# Patient Record
Sex: Male | Born: 1935 | Race: White | Hispanic: No | Marital: Married | Smoking: Never smoker
Health system: Southern US, Community
[De-identification: ages and names within clinical notes are randomized; demographics above are authoritative.]

## PROBLEM LIST (undated history)

## (undated) DIAGNOSIS — E039 Hypothyroidism, unspecified: Secondary | ICD-10-CM

## (undated) DIAGNOSIS — E785 Hyperlipidemia, unspecified: Secondary | ICD-10-CM

## (undated) DIAGNOSIS — E7800 Pure hypercholesterolemia, unspecified: Secondary | ICD-10-CM

## (undated) DIAGNOSIS — I10 Essential (primary) hypertension: Secondary | ICD-10-CM

## (undated) DIAGNOSIS — J309 Allergic rhinitis, unspecified: Secondary | ICD-10-CM

## (undated) DIAGNOSIS — K219 Gastro-esophageal reflux disease without esophagitis: Secondary | ICD-10-CM

## (undated) DIAGNOSIS — E119 Type 2 diabetes mellitus without complications: Secondary | ICD-10-CM

## (undated) DIAGNOSIS — K635 Polyp of colon: Secondary | ICD-10-CM

## (undated) HISTORY — DX: Essential (primary) hypertension: I10

## (undated) HISTORY — DX: Polyp of colon: K63.5

## (undated) HISTORY — DX: Allergic rhinitis, unspecified: J30.9

## (undated) HISTORY — PX: TONSILLECTOMY: SUR1361

## (undated) HISTORY — DX: Type 2 diabetes mellitus without complications: E11.9

## (undated) HISTORY — DX: Gastro-esophageal reflux disease without esophagitis: K21.9

## (undated) HISTORY — PX: FACIAL RECONSTRUCTION SURGERY: SHX631

## (undated) HISTORY — DX: Hypothyroidism, unspecified: E03.9

## (undated) HISTORY — DX: Pure hypercholesterolemia, unspecified: E78.00

## (undated) HISTORY — DX: Hyperlipidemia, unspecified: E78.5

---

## 2003-07-01 ENCOUNTER — Other Ambulatory Visit: Payer: Self-pay

## 2005-08-19 ENCOUNTER — Emergency Department: Payer: Self-pay | Admitting: Emergency Medicine

## 2008-08-09 ENCOUNTER — Emergency Department: Payer: Self-pay | Admitting: Emergency Medicine

## 2010-02-06 ENCOUNTER — Emergency Department: Payer: Self-pay | Admitting: Emergency Medicine

## 2010-02-17 ENCOUNTER — Ambulatory Visit: Payer: Self-pay

## 2010-04-17 ENCOUNTER — Ambulatory Visit: Payer: Self-pay | Admitting: Anesthesiology

## 2010-04-25 ENCOUNTER — Ambulatory Visit: Payer: Self-pay | Admitting: General Practice

## 2010-08-20 ENCOUNTER — Ambulatory Visit: Payer: Self-pay | Admitting: Internal Medicine

## 2010-08-31 ENCOUNTER — Ambulatory Visit: Payer: Self-pay | Admitting: Internal Medicine

## 2010-09-30 ENCOUNTER — Ambulatory Visit: Payer: Self-pay | Admitting: Internal Medicine

## 2010-10-31 ENCOUNTER — Ambulatory Visit: Payer: Self-pay | Admitting: Internal Medicine

## 2012-05-13 DIAGNOSIS — Z8042 Family history of malignant neoplasm of prostate: Secondary | ICD-10-CM | POA: Insufficient documentation

## 2012-05-13 DIAGNOSIS — M5430 Sciatica, unspecified side: Secondary | ICD-10-CM | POA: Insufficient documentation

## 2012-05-13 DIAGNOSIS — N529 Male erectile dysfunction, unspecified: Secondary | ICD-10-CM | POA: Insufficient documentation

## 2013-07-13 ENCOUNTER — Emergency Department: Payer: Self-pay | Admitting: Emergency Medicine

## 2013-07-13 LAB — URINALYSIS, COMPLETE
BACTERIA: NONE SEEN
BILIRUBIN, UR: NEGATIVE
Glucose,UR: NEGATIVE mg/dL (ref 0–75)
Ketone: NEGATIVE
Leukocyte Esterase: NEGATIVE
Nitrite: NEGATIVE
Ph: 7 (ref 4.5–8.0)
Protein: NEGATIVE
SPECIFIC GRAVITY: 1.008 (ref 1.003–1.030)
Squamous Epithelial: NONE SEEN

## 2013-07-13 LAB — CBC WITH DIFFERENTIAL/PLATELET
BASOS PCT: 0.7 %
Basophil #: 0 10*3/uL (ref 0.0–0.1)
Eosinophil #: 0.3 10*3/uL (ref 0.0–0.7)
Eosinophil %: 5.4 %
HCT: 39.6 % — AB (ref 40.0–52.0)
HGB: 12.9 g/dL — ABNORMAL LOW (ref 13.0–18.0)
Lymphocyte #: 1.9 10*3/uL (ref 1.0–3.6)
Lymphocyte %: 30.4 %
MCH: 31.5 pg (ref 26.0–34.0)
MCHC: 32.5 g/dL (ref 32.0–36.0)
MCV: 97 fL (ref 80–100)
Monocyte #: 0.7 x10 3/mm (ref 0.2–1.0)
Monocyte %: 11.8 %
Neutrophil #: 3.2 10*3/uL (ref 1.4–6.5)
Neutrophil %: 51.7 %
Platelet: 161 10*3/uL (ref 150–440)
RBC: 4.08 10*6/uL — ABNORMAL LOW (ref 4.40–5.90)
RDW: 13.4 % (ref 11.5–14.5)
WBC: 6.1 10*3/uL (ref 3.8–10.6)

## 2013-07-13 LAB — CK TOTAL AND CKMB (NOT AT ARMC)
CK, Total: 47 U/L
CK-MB: 1.2 ng/mL (ref 0.5–3.6)

## 2013-07-13 LAB — PROTIME-INR
INR: 1
Prothrombin Time: 12.8 secs (ref 11.5–14.7)

## 2013-07-13 LAB — TROPONIN I: Troponin-I: <0.02 ng/mL

## 2013-07-13 LAB — COMPREHENSIVE METABOLIC PANEL
ALBUMIN: 3.9 g/dL (ref 3.4–5.0)
ALK PHOS: 51 U/L
AST: 24 U/L (ref 15–37)
Anion Gap: 5 — ABNORMAL LOW (ref 7–16)
BUN: 16 mg/dL (ref 7–18)
Bilirubin,Total: 0.5 mg/dL (ref 0.2–1.0)
Calcium, Total: 9 mg/dL (ref 8.5–10.1)
Chloride: 103 mmol/L (ref 98–107)
Co2: 30 mmol/L (ref 21–32)
Creatinine: 0.95 mg/dL (ref 0.60–1.30)
EGFR (African American): >60
GLUCOSE: 132 mg/dL — AB (ref 65–99)
Osmolality: 279 (ref 275–301)
POTASSIUM: 3.6 mmol/L (ref 3.5–5.1)
SGPT (ALT): 25 U/L (ref 12–78)
Sodium: 138 mmol/L (ref 136–145)
TOTAL PROTEIN: 7.5 g/dL (ref 6.4–8.2)

## 2013-07-22 ENCOUNTER — Encounter: Payer: Self-pay | Admitting: Cardiovascular Disease

## 2013-07-22 ENCOUNTER — Ambulatory Visit (INDEPENDENT_AMBULATORY_CARE_PROVIDER_SITE_OTHER): Payer: Medicare Other | Admitting: Cardiovascular Disease

## 2013-07-22 ENCOUNTER — Encounter (INDEPENDENT_AMBULATORY_CARE_PROVIDER_SITE_OTHER): Payer: Self-pay

## 2013-07-22 VITALS — BP 142/82 | HR 65 | Ht 73.0 in | Wt 204.5 lb

## 2013-07-22 DIAGNOSIS — I10 Essential (primary) hypertension: Secondary | ICD-10-CM

## 2013-07-22 DIAGNOSIS — E785 Hyperlipidemia, unspecified: Secondary | ICD-10-CM

## 2013-07-22 DIAGNOSIS — M25519 Pain in unspecified shoulder: Secondary | ICD-10-CM

## 2013-07-22 DIAGNOSIS — R142 Eructation: Secondary | ICD-10-CM

## 2013-07-22 DIAGNOSIS — K219 Gastro-esophageal reflux disease without esophagitis: Secondary | ICD-10-CM

## 2013-07-22 DIAGNOSIS — R143 Flatulence: Secondary | ICD-10-CM

## 2013-07-22 DIAGNOSIS — R141 Gas pain: Secondary | ICD-10-CM

## 2013-07-22 DIAGNOSIS — R9431 Abnormal electrocardiogram [ECG] [EKG]: Secondary | ICD-10-CM

## 2013-07-22 DIAGNOSIS — R140 Abdominal distension (gaseous): Secondary | ICD-10-CM

## 2013-07-22 DIAGNOSIS — E039 Hypothyroidism, unspecified: Secondary | ICD-10-CM

## 2013-07-22 NOTE — Patient Instructions (Signed)
You are doing well. No medication changes were made.  Please call us if you have new issues that need to be addressed before your next appt.    

## 2013-07-22 NOTE — Progress Notes (Signed)
Patient ID: Rickey Marteslton Eugene Vasko [name], male    DOB: 11-18-1929, 80 y.o.   MRN: [id number]  HPI Comments: Rickey Ayala is a pleasant 80 year old gentleman with history of borderline diabetes, prior history of obesity, presenting with symptoms of irregular heartbeat, shoulder pain evaluation in the emergency room 07/13/2013. He has a history of hypertension, GERD, hyperlipidemia, thyroid disease  He reports that on the day of his symptoms, he had significant chili. In fact he reports having 2 bowls  of chili with beans . He had recommend this gas upper and lower that evening, worse and he is having in his entire life. At 3 AM symptoms started. He had some shooting sharp electric type nerve pain to his left shoulder on 2 occasions. He tried Mylanta and eventually went to the emergency room. He did report having an irregular rhythm. This seemed to resolve after history of to the emergency room.  In the ER, lab workup was reviewed EKG was reviewed. Lab work is normal. EKG showed normal sinus rhythm with APCs on every fourth beat otherwise no significant ST or T wave changes  Since leaving the hospital, he has felt well. Prior to this he was very active with no complaints of shortness of breath or chest pain. He has no prior smoking history, has been taking cholesterol pill for 10 years. Most recent total cholesterol 150, LDL 73 He has not been back to exercise since he was seen in the emergency room  EKG today shows normal sinus rhythm with rate 58 beats per minute, nonspecific ST abnormality likely from early repolarization. (EKG on today's office visit than early in his visit showed inferior wall Q waves. This was likely from lead placement error)   Outpatient Encounter Prescriptions as of 07/22/2013  Medication Sig  . aspirin 81 MG tablet Take 81 mg by mouth daily.  Marland Kitchen. atorvastatin (LIPITOR) 20 MG tablet Take 20 mg by mouth daily.  . carvedilol (COREG) 25 MG tablet Take 25 mg by mouth 2 (two) times  daily with a meal.  . finasteride (PROSCAR) 5 MG tablet Take 5 mg by mouth daily.  Marland Kitchen. levothyroxine (SYNTHROID, LEVOTHROID) 88 MCG tablet Take 88 mcg by mouth daily before breakfast.  . lisinopril-hydrochlorothiazide (PRINZIDE,ZESTORETIC) 20-12.5 MG per tablet Take 2 tablets by mouth daily.   . Multiple Vitamin (MULTIVITAMIN) tablet Take 1 tablet by mouth daily.  Marland Kitchen. omeprazole (PRILOSEC) 20 MG capsule Take 20 mg by mouth daily.  . potassium chloride SA (K-DUR,KLOR-CON) 20 MEQ tablet Take 20 mEq by mouth daily.     Review of Systems  Constitutional: Negative.   HENT: Negative.   Eyes: Negative.   Respiratory: Negative.   Cardiovascular: Negative.   Gastrointestinal: Negative.        Recent GI distress with gas, shooting left shoulder pain  Endocrine: Negative.   Musculoskeletal: Negative.   Skin: Negative.   Allergic/Immunologic: Negative.   Neurological: Negative.   Hematological: Negative.   Psychiatric/Behavioral: Negative.   All other systems reviewed and are negative.   BP 142/82  Pulse 65  Ht 6\' 1"  (1.854 m)  Wt 204 lb 8 oz (92.761 kg)  BMI 26.99 kg/m2  Physical Exam  Nursing note and vitals reviewed. Constitutional: He is oriented to person, place, and time. He appears well-developed and well-nourished.  HENT:  Head: Normocephalic.  Nose: Nose normal.  Mouth/Throat: Oropharynx is clear and moist.  Eyes: Conjunctivae are normal. Pupils are equal, round, and reactive to light.  Neck: Normal range of motion.  Neck supple. No JVD present.  Cardiovascular: Normal rate, regular rhythm, S1 normal, S2 normal, normal heart sounds and intact distal pulses.  Exam reveals no gallop and no friction rub.   No murmur heard. Pulmonary/Chest: Effort normal and breath sounds normal. No respiratory distress. He has no wheezes. He has no rales. He exhibits no tenderness.  Abdominal: Soft. Bowel sounds are normal. He exhibits no distension. There is no tenderness.  Musculoskeletal:  Normal range of motion. He exhibits no edema and no tenderness.  Lymphadenopathy:    He has no cervical adenopathy.  Neurological: He is alert and oriented to person, place, and time. Coordination normal.  Skin: Skin is warm and dry. No rash noted. No erythema.  Psychiatric: He has a normal mood and affect. His behavior is normal. Judgment and thought content normal.      Assessment and Plan

## 2013-07-22 NOTE — Assessment & Plan Note (Signed)
He recently started himself on over-the-counter omeprazole

## 2013-07-22 NOTE — Assessment & Plan Note (Signed)
Blood pressure is well controlled on today's visit. No changes made to the medications. 

## 2013-07-22 NOTE — Assessment & Plan Note (Signed)
Recent symptoms of GI gas likely from eating chili in excess. No further symptoms since that time. Radiating pain to his left shoulder likely from gas pain

## 2013-07-22 NOTE — Assessment & Plan Note (Signed)
Monitored by Dr. Dareen PianoAnderson

## 2013-07-22 NOTE — Assessment & Plan Note (Signed)
Atypical for cardiac etiology. Symptoms in the setting of gas pain. No further symptoms since that time. Recommended he restart his exercise program and if he has additional episodes of shoulder pain or chest pain, that he call our office for further evaluation

## 2013-07-22 NOTE — Assessment & Plan Note (Signed)
Cholesterol is at goal on the current lipid regimen. No changes to the medications were made.  

## 2013-07-29 ENCOUNTER — Encounter: Payer: Self-pay | Admitting: Cardiovascular Disease

## 2013-08-29 DIAGNOSIS — I119 Hypertensive heart disease without heart failure: Secondary | ICD-10-CM | POA: Insufficient documentation

## 2013-08-29 DIAGNOSIS — E1169 Type 2 diabetes mellitus with other specified complication: Secondary | ICD-10-CM | POA: Insufficient documentation

## 2013-08-29 DIAGNOSIS — E1122 Type 2 diabetes mellitus with diabetic chronic kidney disease: Secondary | ICD-10-CM | POA: Insufficient documentation

## 2013-08-29 DIAGNOSIS — N181 Chronic kidney disease, stage 1: Secondary | ICD-10-CM

## 2013-08-29 DIAGNOSIS — E785 Hyperlipidemia, unspecified: Secondary | ICD-10-CM

## 2015-03-15 DIAGNOSIS — Z0000 Encounter for general adult medical examination without abnormal findings: Secondary | ICD-10-CM | POA: Insufficient documentation

## 2015-03-16 IMAGING — CR DG CHEST 1V PORT
1 series · 1 of 1 positions shown · non-contrast
Comparison: DG CHEST 1V PORT dated 08/09/2008

CLINICAL DATA: Chest pain

EXAM:
PORTABLE CHEST - 1 VIEW

[ap]
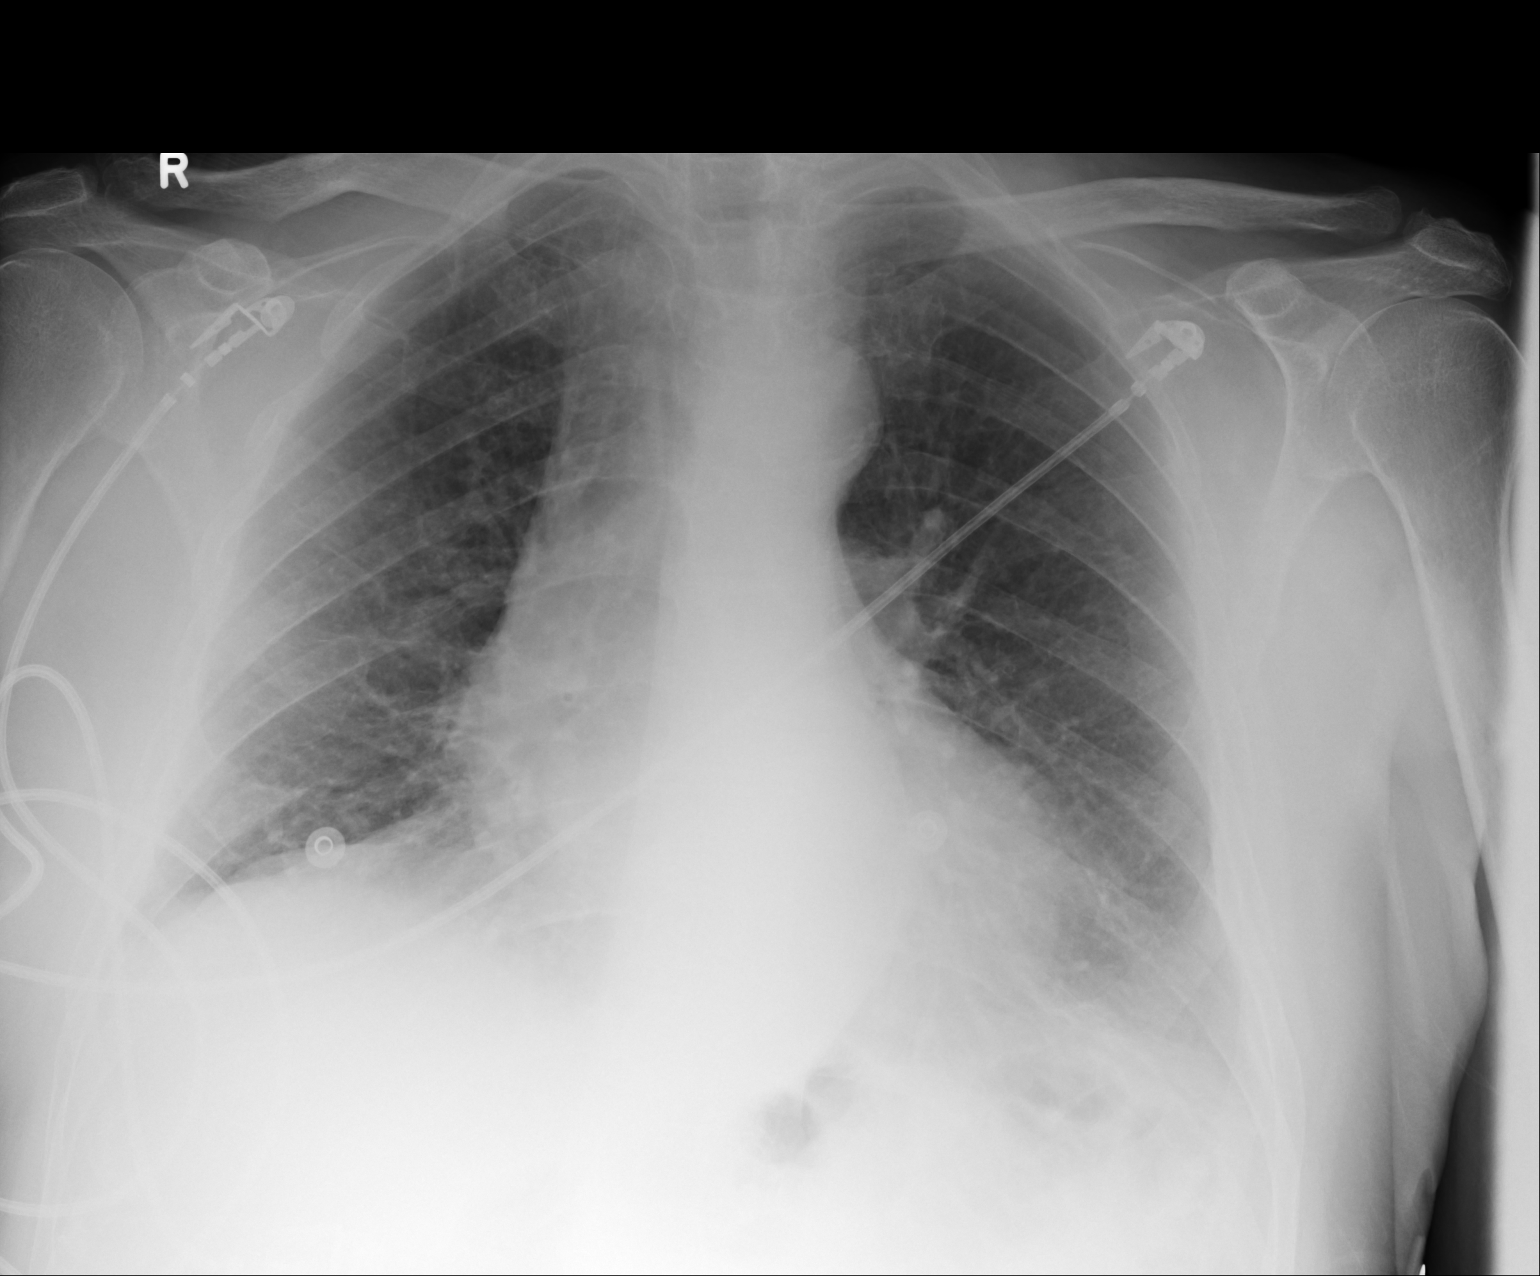

[1 of 1 positions shown; findings below may reference images not displayed]

FINDINGS: The heart size and mediastinal contours are within normal limits.
Both lungs are clear. The visualized skeletal structures are
unremarkable.
IMPRESSION: No active disease.

## 2017-06-26 ENCOUNTER — Ambulatory Visit: Payer: Self-pay | Admitting: Podiatry

## 2017-07-24 ENCOUNTER — Ambulatory Visit (INDEPENDENT_AMBULATORY_CARE_PROVIDER_SITE_OTHER): Payer: Medicare Other | Admitting: Podiatry

## 2017-07-24 ENCOUNTER — Encounter: Payer: Self-pay | Admitting: Podiatry

## 2017-07-24 DIAGNOSIS — M79674 Pain in right toe(s): Secondary | ICD-10-CM | POA: Diagnosis not present

## 2017-07-24 DIAGNOSIS — M79675 Pain in left toe(s): Secondary | ICD-10-CM | POA: Diagnosis not present

## 2017-07-24 DIAGNOSIS — B351 Tinea unguium: Secondary | ICD-10-CM

## 2017-07-24 DIAGNOSIS — L600 Ingrowing nail: Secondary | ICD-10-CM | POA: Diagnosis not present

## 2017-07-24 NOTE — Progress Notes (Signed)
Complaint:  Visit Type: Patient returns to my office for  preventative foot care services. Complaint: Patient states" my nails have grown long and thick and become painful to walk and wear shoes" Patient has been treated by Dr. Hyatt for ingrowing toenails.. The patient presents for preventative foot care services. No changes to ROS  Podiatric Exam: Vascular: dorsalis pedis and posterior tibial pulses are weakly  palpable bilateral. Capillary return is immediate. Temperature gradient is WNL. Skin turgor WNL  Sensorium: Normal Semmes Weinstein monofilament test. Normal tactile sensation bilaterally. Nail Exam: Pt has thick disfigured discolored nails with subungual debris noted bilateral entire nail hallux. Ulcer Exam: There is no evidence of ulcer or pre-ulcerative changes or infection. Orthopedic Exam: Muscle tone and strength are WNL. No limitations in general ROM. No crepitus or effusions noted. Foot type and digits show no abnormalities. Bony prominences are unremarkable. Skin: No Porokeratosis. No infection or ulcers  Diagnosis:  Onychomycosis, , Pain in right toe, pain in left toes  Treatment & Plan Procedures and Treatment: Consent by patient was obtained for treatment procedures.   Debridement of mycotic and hypertrophic toenails, 1 through 5 bilateral and clearing of subungual debris. No ulceration, no infection noted.  Return Visit-Office Procedure: Patient instructed to return to the office for a follow up visit 3 months for continued evaluation and treatment.    Kessler Kopinski DPM 

## 2017-10-23 ENCOUNTER — Ambulatory Visit: Payer: Medicare Other | Admitting: Podiatry

## 2017-10-27 ENCOUNTER — Ambulatory Visit (INDEPENDENT_AMBULATORY_CARE_PROVIDER_SITE_OTHER): Payer: Medicare Other | Admitting: Podiatry

## 2017-10-27 ENCOUNTER — Encounter: Payer: Self-pay | Admitting: Podiatry

## 2017-10-27 DIAGNOSIS — L600 Ingrowing nail: Secondary | ICD-10-CM

## 2017-10-27 DIAGNOSIS — M79675 Pain in left toe(s): Secondary | ICD-10-CM

## 2017-10-27 DIAGNOSIS — M79674 Pain in right toe(s): Secondary | ICD-10-CM | POA: Diagnosis not present

## 2017-10-27 DIAGNOSIS — B351 Tinea unguium: Secondary | ICD-10-CM

## 2017-10-27 NOTE — Progress Notes (Signed)
Complaint:  Visit Type: Patient returns to my office for  preventative foot care services. Complaint: Patient states" my nails have grown long and thick and become painful to walk and wear shoes" Patient has been treated by Dr. Hyatt for ingrowing toenails.. The patient presents for preventative foot care services. No changes to ROS  Podiatric Exam: Vascular: dorsalis pedis and posterior tibial pulses are weakly  palpable bilateral. Capillary return is immediate. Temperature gradient is WNL. Skin turgor WNL  Sensorium: Normal Semmes Weinstein monofilament test. Normal tactile sensation bilaterally. Nail Exam: Pt has thick disfigured discolored nails with subungual debris noted bilateral entire nail hallux. Ulcer Exam: There is no evidence of ulcer or pre-ulcerative changes or infection. Orthopedic Exam: Muscle tone and strength are WNL. No limitations in general ROM. No crepitus or effusions noted. Foot type and digits show no abnormalities. Bony prominences are unremarkable. Skin: No Porokeratosis. No infection or ulcers  Diagnosis:  Onychomycosis, , Pain in right toe, pain in left toes  Treatment & Plan Procedures and Treatment: Consent by patient was obtained for treatment procedures.   Debridement of mycotic and hypertrophic toenails, 1 through 5 bilateral and clearing of subungual debris. No ulceration, no infection noted.  Return Visit-Office Procedure: Patient instructed to return to the office for a follow up visit 3 months for continued evaluation and treatment.    Delesa Kawa DPM 

## 2017-11-05 ENCOUNTER — Telehealth: Payer: Self-pay | Admitting: Urology

## 2017-11-05 NOTE — Telephone Encounter (Signed)
Pt needs refill sent to [facility] on [address], has appt on 12/11/17. [facility] transfer from [facility].  finasteride (PROSCAR) 5 MG tablet [[id number]][[id number]]

## 2017-11-06 MED ORDER — FINASTERIDE 5 MG PO TABS: 5.0000 mg | ORAL_TABLET | Freq: Every day | ORAL | 1 refills | Status: DC

## 2017-12-11 ENCOUNTER — Ambulatory Visit (INDEPENDENT_AMBULATORY_CARE_PROVIDER_SITE_OTHER): Payer: Medicare Other | Admitting: Urology

## 2017-12-11 ENCOUNTER — Encounter: Payer: Self-pay | Admitting: Urology

## 2017-12-11 VITALS — BP 146/81 | HR 82 | Ht 72.25 in | Wt 202.4 lb

## 2017-12-11 DIAGNOSIS — R972 Elevated prostate specific antigen [PSA]: Secondary | ICD-10-CM

## 2017-12-11 DIAGNOSIS — N401 Enlarged prostate with lower urinary tract symptoms: Secondary | ICD-10-CM | POA: Diagnosis not present

## 2017-12-11 MED ORDER — FINASTERIDE 5 MG PO TABS: 5.0000 mg | ORAL_TABLET | Freq: Every day | ORAL | 3 refills | Status: DC

## 2017-12-11 NOTE — Progress Notes (Signed)
12/11/2017 4:03 PM   Rickey MartesAlton Eugene Kirks [name] 1929-12-18 [id number]  Referring provider: Lauro RegulusAnderson, Marshall W, MD [address] [facility] [place], [place] [id number]  Chief Complaint  Patient presents with  . Establish Care    HPI: 84 year old male presents to establish local urologic care.  He has been followed by Dr. Achilles Dunkope for the past several years for BPH with lower urinary tract symptoms.  He remains on finasteride.  He has mild lower urinary tract symptoms including frequency, urgency, intermittent urinary stream and nocturia x2.  His voiding pattern is stable and not bothersome.  Denies dysuria or gross hematuria.  Denies flank, abdominal, pelvic or scrotal pain.  His [facility] records mention history of an elevated PSA however there is no reference to a prior biopsy and all of his PSA levels have been fairly stable over the last several years.  His uncorrected PSA last year was 2.4 and in 2017 was 1.6.  He does state he had a biopsy performed at [facility] Wynn [facility] for PSA in the upper 4 range which was benign.   PMH: Past Medical History:  Diagnosis Date  . Allergic rhinitis   . Colon polyps   . Essential hypertension, benign   . GERD (gastroesophageal reflux disease)   . Other and unspecified hyperlipidemia   . Pure hypercholesterolemia   . Type II or unspecified type diabetes mellitus without mention of complication, not stated as uncontrolled   . Unspecified essential hypertension   . Unspecified hypothyroidism     Surgical History: Past Surgical History:  Procedure Laterality Date  . FACIAL RECONSTRUCTION SURGERY     muscle surgery   . TONSILLECTOMY      Home Medications:  Allergies as of 12/11/2017      Reactions   Niacin Itching   Penicillins Rash   Rash & joint pain.  And joint pain And joint pain      Medication List        Accurate as of 12/11/17  4:03 PM. Always use your most recent med list.            amLODipine 5 MG tablet Commonly known as:  NORVASC Take 5 mg by mouth daily.   aspirin EC 81 MG tablet Take by mouth.   atorvastatin 20 MG tablet Commonly known as:  LIPITOR TAKE 1 TABLET BY MOUTH ONCE DAILY   carvedilol 12.5 MG tablet Commonly known as:  COREG   finasteride 5 MG tablet Commonly known as:  PROSCAR Take 1 tablet (5 mg total) by mouth daily.   KLOR-CON M20 20 MEQ tablet Generic drug:  potassium chloride SA TAKE 1 TABLET BY MOUTH ONCE DAILY   levothyroxine 112 MCG tablet Commonly known as:  SYNTHROID, LEVOTHROID TAKE 1 TABLET BY MOUTH ONCE DAILY ON AN EMPTY STOMACH WITH A GLASS OF WATER AT LEAST 30 TO 60 MINUTES BEFORE BREAKFAST   lisinopril-hydrochlorothiazide 20-12.5 MG tablet Commonly known as:  PRINZIDE,ZESTORETIC TAKE 2 TABLETS BY MOUTH ONCE DAILY   LYCOPENE PO Take by mouth.   multivitamin tablet Take 1 tablet by mouth daily.   ONE TOUCH ULTRA TEST test strip Generic drug:  glucose blood Use 2 (two) times daily.   ONETOUCH DELICA LANCETS 33G Misc Use 1 each 2 (two) times daily.       Allergies:  Allergies  Allergen Reactions  . Niacin Itching  . Penicillins Rash    Rash & joint pain.  And joint pain And joint pain  Family History: Family History  Problem Relation Age of Onset  . Heart attack Mother   . Heart disease Mother   . Hypertension Mother   . Hyperlipidemia Mother     Social History:  reports that he has never smoked. He has never used smokeless tobacco. He reports that he does not drink alcohol or use drugs.  ROS: UROLOGY Frequent Urination?: Yes Hard to postpone urination?: Yes Burning/pain with urination?: No Get up at night to urinate?: Yes Leakage of urine?: Yes Urine stream starts and stops?: Yes Trouble starting stream?: No Do you have to strain to urinate?: No Blood in urine?: No Urinary tract infection?: No Sexually transmitted disease?: No Injury to kidneys or bladder?: No Painful  intercourse?: No Weak stream?: Yes Erection problems?: No Penile pain?: No  Gastrointestinal Nausea?: No Vomiting?: No Indigestion/heartburn?: No Diarrhea?: No Constipation?: Yes  Constitutional Fever: No Night sweats?: No Weight loss?: No Fatigue?: Yes  Skin Skin rash/lesions?: No Itching?: No  Eyes Blurred vision?: No Double vision?: No  Ears/Nose/Throat Sore throat?: No Sinus problems?: No  Hematologic/Lymphatic Swollen glands?: No Easy bruising?: No  Cardiovascular Leg swelling?: Yes Chest pain?: No  Respiratory Cough?: No Shortness of breath?: No  Endocrine Excessive thirst?: No  Musculoskeletal Back pain?: No Joint pain?: No  Neurological Headaches?: No Dizziness?: No  Psychologic Depression?: No Anxiety?: No  Physical Exam: BP (!) 146/81 (BP Location: Left Arm, Patient Position: Sitting, Cuff Size: Large)   Pulse 82   Ht 6' 0.25" (1.835 m)   Wt 202 lb 6.4 oz (91.8 kg)   BMI 27.26 kg/m   Constitutional:  Alert and oriented, No acute distress. HEENT:  AT, moist mucus membranes.  Trachea midline, no masses. Cardiovascular: No clubbing, cyanosis, or edema. Respiratory: Normal respiratory effort, no increased work of breathing. GI: Abdomen is soft, nontender, nondistended, no abdominal masses GU: No CVA tenderness.  Prostate 40 g, smooth without nodules Lymph: No cervical or inguinal lymphadenopathy. Skin: No rashes, bruises or suspicious lesions. Neurologic: Grossly intact, no focal deficits, moving all 4 extremities. Psychiatric: Normal mood and affect.   Assessment & Plan:   84 year old male with stable lower urinary tract symptoms on finasteride.  His finasteride was refilled.  I did discuss PSA testing.  Dr. Achilles Dunk was performing annually however based on his age and stability feel that this can be discontinued.  He requested to have his PSA checked today.  Return in about 1 year (around 12/12/2018) for Recheck.  Riki Altes, MD  [facility] [address], [address] [place], [place] [address] [phone]

## 2017-12-12 ENCOUNTER — Telehealth: Payer: Self-pay

## 2017-12-12 LAB — PSA: Prostate Specific Ag, Serum: 1.3 ng/mL (ref 0.0–4.0)

## 2017-12-12 NOTE — Telephone Encounter (Signed)
-----   Message from Riki AltesScott C Stoioff, MD sent at 12/12/2017 10:05 AM EDT ----- PSA looks good at 1.3

## 2017-12-12 NOTE — Telephone Encounter (Signed)
Patients wife notified

## 2017-12-13 ENCOUNTER — Encounter: Payer: Self-pay | Admitting: Urology

## 2017-12-13 DIAGNOSIS — N401 Enlarged prostate with lower urinary tract symptoms: Secondary | ICD-10-CM | POA: Insufficient documentation

## 2017-12-13 DIAGNOSIS — R972 Elevated prostate specific antigen [PSA]: Principal | ICD-10-CM

## 2017-12-13 DIAGNOSIS — Z87898 Personal history of other specified conditions: Secondary | ICD-10-CM | POA: Insufficient documentation

## 2018-01-29 ENCOUNTER — Ambulatory Visit (INDEPENDENT_AMBULATORY_CARE_PROVIDER_SITE_OTHER): Payer: Medicare Other | Admitting: Podiatry

## 2018-01-29 ENCOUNTER — Encounter: Payer: Self-pay | Admitting: Podiatry

## 2018-01-29 DIAGNOSIS — M79674 Pain in right toe(s): Secondary | ICD-10-CM | POA: Diagnosis not present

## 2018-01-29 DIAGNOSIS — B351 Tinea unguium: Secondary | ICD-10-CM | POA: Diagnosis not present

## 2018-01-29 DIAGNOSIS — M79675 Pain in left toe(s): Secondary | ICD-10-CM

## 2018-01-29 DIAGNOSIS — L600 Ingrowing nail: Secondary | ICD-10-CM

## 2018-01-29 NOTE — Progress Notes (Signed)
Complaint:  Visit Type: Patient returns to my office for  preventative foot care services. Complaint: Patient states" my nails have grown long and thick and become painful to walk and wear shoes" Patient has been treated by Dr. Hyatt for ingrowing toenails.. The patient presents for preventative foot care services. No changes to ROS  Podiatric Exam: Vascular: dorsalis pedis and posterior tibial pulses are weakly  palpable bilateral. Capillary return is immediate. Temperature gradient is WNL. Skin turgor WNL  Sensorium: Normal Semmes Weinstein monofilament test. Normal tactile sensation bilaterally. Nail Exam: Pt has thick disfigured discolored nails with subungual debris noted bilateral entire nail hallux. Ulcer Exam: There is no evidence of ulcer or pre-ulcerative changes or infection. Orthopedic Exam: Muscle tone and strength are WNL. No limitations in general ROM. No crepitus or effusions noted. Foot type and digits show no abnormalities. Bony prominences are unremarkable. Skin: No Porokeratosis. No infection or ulcers  Diagnosis:  Onychomycosis, , Pain in right toe, pain in left toes  Treatment & Plan Procedures and Treatment: Consent by patient was obtained for treatment procedures.   Debridement of mycotic and hypertrophic toenails, 1 through 5 bilateral and clearing of subungual debris. No ulceration, no infection noted.  Return Visit-Office Procedure: Patient instructed to return to the office for a follow up visit 3 months for continued evaluation and treatment.    Samanthia Howland DPM 

## 2018-04-30 ENCOUNTER — Ambulatory Visit (INDEPENDENT_AMBULATORY_CARE_PROVIDER_SITE_OTHER): Payer: Medicare Other | Admitting: Podiatry

## 2018-04-30 ENCOUNTER — Encounter: Payer: Self-pay | Admitting: Podiatry

## 2018-04-30 DIAGNOSIS — M79675 Pain in left toe(s): Secondary | ICD-10-CM | POA: Diagnosis not present

## 2018-04-30 DIAGNOSIS — B351 Tinea unguium: Secondary | ICD-10-CM | POA: Diagnosis not present

## 2018-04-30 DIAGNOSIS — M79674 Pain in right toe(s): Secondary | ICD-10-CM | POA: Diagnosis not present

## 2018-04-30 NOTE — Progress Notes (Signed)
Complaint:  Visit Type: Patient returns to my office for  preventative foot care services. Complaint: Patient states" my nails have grown long and thick and become painful to walk and wear shoes" Patient has been treated by Dr. Al Corpus for ingrowing toenails.. The patient presents for preventative foot care services. No changes to ROS  Podiatric Exam: Vascular: dorsalis pedis and posterior tibial pulses are weakly  palpable bilateral. Capillary return is immediate. Temperature gradient is WNL. Skin turgor WNL  Sensorium: Normal Semmes Weinstein monofilament test. Normal tactile sensation bilaterally. Nail Exam: Pt has thick disfigured discolored nails with subungual debris noted bilateral entire nail hallux. Ulcer Exam: There is no evidence of ulcer or pre-ulcerative changes or infection. Orthopedic Exam: Muscle tone and strength are WNL. No limitations in general ROM. No crepitus or effusions noted. Foot type and digits show no abnormalities. Bony prominences are unremarkable. Skin: No Porokeratosis. No infection or ulcers  Diagnosis:  Onychomycosis, , Pain in right toe, pain in left toes  Treatment & Plan Procedures and Treatment: Consent by patient was obtained for treatment procedures.   Debridement of mycotic and hypertrophic toenails, 1 through 5 bilateral and clearing of subungual debris. No ulceration, no infection noted.  Return Visit-Office Procedure: Patient instructed to return to the office for a follow up visit 3 months for continued evaluation and treatment.    Helane Gunther DPM

## 2018-07-30 ENCOUNTER — Ambulatory Visit (INDEPENDENT_AMBULATORY_CARE_PROVIDER_SITE_OTHER): Payer: Medicare Other | Admitting: Podiatry

## 2018-07-30 ENCOUNTER — Other Ambulatory Visit: Payer: Self-pay

## 2018-07-30 ENCOUNTER — Encounter: Payer: Self-pay | Admitting: Podiatry

## 2018-07-30 VITALS — Temp 97.9°F

## 2018-07-30 DIAGNOSIS — B351 Tinea unguium: Secondary | ICD-10-CM | POA: Diagnosis not present

## 2018-07-30 DIAGNOSIS — L600 Ingrowing nail: Secondary | ICD-10-CM

## 2018-07-30 DIAGNOSIS — M79675 Pain in left toe(s): Secondary | ICD-10-CM | POA: Diagnosis not present

## 2018-07-30 DIAGNOSIS — M79674 Pain in right toe(s): Secondary | ICD-10-CM

## 2018-07-30 NOTE — Progress Notes (Signed)
Complaint:  Visit Type: Patient returns to my office for  preventative foot care services. Complaint: Patient states" my nails have grown long and thick and become painful to walk and wear shoes" . The patient presents for preventative foot care services. No changes to ROS  Podiatric Exam: Vascular: dorsalis pedis and posterior tibial pulses are weakly  palpable bilateral. Capillary return is immediate. Temperature gradient is WNL. Skin turgor WNL  Sensorium: Normal Semmes Weinstein monofilament test. Normal tactile sensation bilaterally. Nail Exam: Pt has thick disfigured discolored nails with subungual debris noted bilateral entire nail hallux. Ulcer Exam: There is no evidence of ulcer or pre-ulcerative changes or infection. Orthopedic Exam: Muscle tone and strength are WNL. No limitations in general ROM. No crepitus or effusions noted. Foot type and digits show no abnormalities. Bony prominences are unremarkable. Skin: No Porokeratosis. No infection or ulcers  Diagnosis:  Onychomycosis, , Pain in right toe, pain in left toes  Treatment & Plan Procedures and Treatment: Consent by patient was obtained for treatment procedures.   Debridement of mycotic and hypertrophic toenails, 1 through 5 bilateral and clearing of subungual debris. No ulceration, no infection noted.  Return Visit-Office Procedure: Patient instructed to return to the office for a follow up visit 3 months for continued evaluation and treatment.    Averil Digman DPM 

## 2018-10-29 ENCOUNTER — Encounter: Payer: Self-pay | Admitting: Podiatry

## 2018-10-29 ENCOUNTER — Ambulatory Visit (INDEPENDENT_AMBULATORY_CARE_PROVIDER_SITE_OTHER): Payer: Medicare Other | Admitting: Podiatry

## 2018-10-29 ENCOUNTER — Other Ambulatory Visit: Payer: Self-pay

## 2018-10-29 VITALS — Temp 98.5°F

## 2018-10-29 DIAGNOSIS — M79675 Pain in left toe(s): Secondary | ICD-10-CM | POA: Diagnosis not present

## 2018-10-29 DIAGNOSIS — M79674 Pain in right toe(s): Secondary | ICD-10-CM

## 2018-10-29 DIAGNOSIS — B351 Tinea unguium: Secondary | ICD-10-CM | POA: Diagnosis not present

## 2018-10-29 DIAGNOSIS — L600 Ingrowing nail: Secondary | ICD-10-CM

## 2018-10-29 NOTE — Progress Notes (Signed)
Complaint:  Visit Type: Patient returns to my office for  preventative foot care services. Complaint: Patient states" my nails have grown long and thick and become painful to walk and wear shoes" . The patient presents for preventative foot care services. No changes to ROS  Podiatric Exam: Vascular: dorsalis pedis and posterior tibial pulses are weakly  palpable bilateral. Capillary return is immediate. Temperature gradient is WNL. Skin turgor WNL  Sensorium: Normal Semmes Weinstein monofilament test. Normal tactile sensation bilaterally. Nail Exam: Pt has thick disfigured discolored nails with subungual debris noted bilateral entire nail hallux. Ulcer Exam: There is no evidence of ulcer or pre-ulcerative changes or infection. Orthopedic Exam: Muscle tone and strength are WNL. No limitations in general ROM. No crepitus or effusions noted. Foot type and digits show no abnormalities. Bony prominences are unremarkable. Skin: No Porokeratosis. No infection or ulcers  Diagnosis:  Onychomycosis, , Pain in right toe, pain in left toes  Treatment & Plan Procedures and Treatment: Consent by patient was obtained for treatment procedures.   Debridement of mycotic and hypertrophic toenails, 1 through 5 bilateral and clearing of subungual debris. No ulceration, no infection noted.  Return Visit-Office Procedure: Patient instructed to return to the office for a follow up visit 3 months for continued evaluation and treatment.    Gardiner Barefoot DPM

## 2018-11-24 ENCOUNTER — Telehealth: Payer: Self-pay | Admitting: Urology

## 2018-11-24 NOTE — Telephone Encounter (Signed)
We had to push patient's app out and he needs a refill on his finastride called into [facility] his follow up app is 01-04-19   Sharyn Lull

## 2018-11-25 MED ORDER — FINASTERIDE 5 MG PO TABS: 5.0000 mg | ORAL_TABLET | Freq: Every day | ORAL | 0 refills | Status: DC

## 2018-12-14 ENCOUNTER — Ambulatory Visit: Payer: Medicare Other | Admitting: Urology

## 2019-01-04 ENCOUNTER — Ambulatory Visit (INDEPENDENT_AMBULATORY_CARE_PROVIDER_SITE_OTHER): Payer: Medicare Other | Admitting: Urology

## 2019-01-04 ENCOUNTER — Encounter: Payer: Self-pay | Admitting: Urology

## 2019-01-04 ENCOUNTER — Other Ambulatory Visit: Payer: Self-pay

## 2019-01-04 VITALS — BP 169/77 | HR 36 | Ht 72.0 in | Wt 205.7 lb

## 2019-01-04 DIAGNOSIS — N401 Enlarged prostate with lower urinary tract symptoms: Secondary | ICD-10-CM

## 2019-01-04 DIAGNOSIS — Z87898 Personal history of other specified conditions: Secondary | ICD-10-CM | POA: Diagnosis not present

## 2019-01-04 MED ORDER — FINASTERIDE 5 MG PO TABS: 5.0000 mg | ORAL_TABLET | Freq: Every day | ORAL | 3 refills | Status: DC

## 2019-01-04 NOTE — Progress Notes (Signed)
01/04/2019 10:12 AM   Rickey Martes [name] September 17, 1929 [id number]  Referring provider: Lauro Regulus, MD [address] [facility] [place],  [place] [address]  Chief Complaint  Patient presents with  . Elevated PSA    Urologic history: 1.  BPH with lower urinary tract symptoms  -Finasteride daily  2.  History elevated PSA  -Benign biopsy >10 years ago for PSA in upper 4 range   HPI: 85 y.o. male presents for annual follow-up.  Since his visit last year he does have increased urinary urgency when standing.  He will have occasional episodes of urge incontinence.  He remains on finasteride.  Denies dysuria or gross hematuria.  Denies flank, abdominal or pelvic pain.   PMH: Past Medical History:  Diagnosis Date  . Allergic rhinitis   . Colon polyps   . Essential hypertension, benign   . GERD (gastroesophageal reflux disease)   . Other and unspecified hyperlipidemia   . Pure hypercholesterolemia   . Type II or unspecified type diabetes mellitus without mention of complication, not stated as uncontrolled   . Unspecified essential hypertension   . Unspecified hypothyroidism     Surgical History: Past Surgical History:  Procedure Laterality Date  . FACIAL RECONSTRUCTION SURGERY     muscle surgery   . TONSILLECTOMY      Home Medications:  Allergies as of 01/04/2019      Reactions   Niacin Itching   Penicillins Rash   Rash & joint pain.  And joint pain And joint pain      Medication List       Accurate as of January 04, 2019 10:12 AM. If you have any questions, ask your nurse or doctor.        amLODipine 2.5 MG tablet Commonly known as: NORVASC   aspirin EC 81 MG tablet Take by mouth.   atorvastatin 20 MG tablet Commonly known as: LIPITOR TAKE 1 TABLET BY MOUTH ONCE DAILY   carvedilol 12.5 MG tablet Commonly known as: COREG   finasteride 5 MG tablet Commonly known as: PROSCAR Take 1 tablet (5 mg total) by mouth daily.    Klor-Con M20 20 MEQ tablet Generic drug: potassium chloride SA TAKE 1 TABLET BY MOUTH ONCE DAILY   levothyroxine 112 MCG tablet Commonly known as: SYNTHROID TAKE 1 TABLET BY MOUTH ONCE DAILY ON AN EMPTY STOMACH WITH A GLASS OF WATER AT LEAST 30 TO 60 MINUTES BEFORE BREAKFAST   lisinopril-hydrochlorothiazide 20-12.5 MG tablet Commonly known as: ZESTORETIC TAKE 2 TABLETS BY MOUTH ONCE DAILY   LYCOPENE PO Take by mouth.   metFORMIN 500 MG 24 hr tablet Commonly known as: GLUCOPHAGE-XR Take by mouth.   multivitamin tablet Take 1 tablet by mouth daily.   ONE TOUCH ULTRA TEST test strip Generic drug: glucose blood Use 2 (two) times daily.   OneTouch Delica Lancets 33G Misc Use 1 each 2 (two) times daily.       Allergies:  Allergies  Allergen Reactions  . Niacin Itching  . Penicillins Rash    Rash & joint pain.  And joint pain And joint pain     Family History: Family History  Problem Relation Age of Onset  . Heart attack Mother   . Heart disease Mother   . Hypertension Mother   . Hyperlipidemia Mother     Social History:  reports that he has never smoked. He has never used smokeless tobacco. He reports that he does not drink alcohol or  use drugs.  ROS: UROLOGY Frequent Urination?: No Hard to postpone urination?: Yes Burning/pain with urination?: No Get up at night to urinate?: Yes Leakage of urine?: Yes Urine stream starts and stops?: Yes Trouble starting stream?: No Do you have to strain to urinate?: No Blood in urine?: No Urinary tract infection?: No Sexually transmitted disease?: No Injury to kidneys or bladder?: No Painful intercourse?: No Weak stream?: No Erection problems?: No Penile pain?: No  Gastrointestinal Nausea?: No Vomiting?: No Indigestion/heartburn?: No Diarrhea?: No Constipation?: No  Constitutional Fever: No Night sweats?: No Weight loss?: No Fatigue?: No  Skin Skin rash/lesions?: No Itching?: No  Eyes Blurred  vision?: No Double vision?: No  Ears/Nose/Throat Sore throat?: No Sinus problems?: No  Hematologic/Lymphatic Swollen glands?: No Easy bruising?: No  Cardiovascular Leg swelling?: No Chest pain?: No  Respiratory Cough?: No Shortness of breath?: No  Endocrine Excessive thirst?: No  Musculoskeletal Back pain?: No Joint pain?: No  Neurological Headaches?: No Dizziness?: No  Psychologic Depression?: No Anxiety?: No  Physical Exam: BP (!) 169/77 (BP Location: Left Arm, Patient Position: Sitting, Cuff Size: Normal)   Pulse (!) 36   Ht 6' (1.829 m)   Wt 205 lb 11.2 oz (93.3 kg)   BMI 27.90 kg/m   Constitutional:  Alert and oriented, No acute distress. HEENT: [place] AT, moist mucus membranes.  Trachea midline, no masses. Cardiovascular: No clubbing, cyanosis, or edema. Respiratory: Normal respiratory effort, no increased work of breathing. GI: Abdomen is soft, nontender, nondistended, no abdominal masses GU: No CVA tenderness Skin: No rashes, bruises or suspicious lesions. Neurologic: Grossly intact, no focal deficits, moving all 4 extremities. Psychiatric: Normal mood and affect.   Assessment & Plan:    - BPH with lower urinary tract symptoms Since his last visit increase towards related symptoms of urgency with occasional episodes of urge incontinence.  He states his symptoms are bothersome enough that he would like to try additional medication.  Finasteride was refilled.  Samples of Myrbetriq 25 mg (#28; lot [id number], exp 04/2020).  If effective he will call back for an Rx.  DC PSA screening based on age and AUA recommendations.  Continue annual follow-up.   Abbie Sons, [place] [address], [place], [place] [address] [phone]

## 2019-01-28 ENCOUNTER — Other Ambulatory Visit: Payer: Self-pay

## 2019-01-28 ENCOUNTER — Ambulatory Visit (INDEPENDENT_AMBULATORY_CARE_PROVIDER_SITE_OTHER): Payer: Medicare Other | Admitting: Podiatry

## 2019-01-28 ENCOUNTER — Encounter: Payer: Self-pay | Admitting: Podiatry

## 2019-01-28 ENCOUNTER — Telehealth: Payer: Self-pay | Admitting: Urology

## 2019-01-28 DIAGNOSIS — B351 Tinea unguium: Secondary | ICD-10-CM

## 2019-01-28 DIAGNOSIS — N401 Enlarged prostate with lower urinary tract symptoms: Secondary | ICD-10-CM

## 2019-01-28 DIAGNOSIS — M79674 Pain in right toe(s): Secondary | ICD-10-CM

## 2019-01-28 DIAGNOSIS — M79675 Pain in left toe(s): Secondary | ICD-10-CM | POA: Diagnosis not present

## 2019-01-28 MED ORDER — MIRABEGRON ER 50 MG PO TB24: 50.0000 mg | ORAL_TABLET | Freq: Every day | ORAL | 11 refills | Status: DC

## 2019-01-28 NOTE — Progress Notes (Signed)
Complaint:  Visit Type: Patient returns to my office for  preventative foot care services. Complaint: Patient states" my nails have grown long and thick and become painful to walk and wear shoes" . The patient presents for preventative foot care services. No changes to ROS  Podiatric Exam: Vascular: dorsalis pedis and posterior tibial pulses are weakly  palpable bilateral. Capillary return is immediate. Temperature gradient is WNL. Skin turgor WNL  Sensorium: Normal Semmes Weinstein monofilament test. Normal tactile sensation bilaterally. Nail Exam: Pt has thick disfigured discolored nails with subungual debris noted bilateral entire nail hallux. Ulcer Exam: There is no evidence of ulcer or pre-ulcerative changes or infection. Orthopedic Exam: Muscle tone and strength are WNL. No limitations in general ROM. No crepitus or effusions noted. Foot type and digits show no abnormalities. Bony prominences are unremarkable. Skin: No Porokeratosis. No infection or ulcers  Diagnosis:  Onychomycosis, , Pain in right toe, pain in left toes  Treatment & Plan Procedures and Treatment: Consent by patient was obtained for treatment procedures.   Debridement of mycotic and hypertrophic toenails, 1 through 5 bilateral and clearing of subungual debris. No ulceration, no infection noted.  Return Visit-Office Procedure: Patient instructed to return to the office for a follow up visit 3 months for continued evaluation and treatment.    Gardiner Barefoot DPM

## 2019-01-28 NOTE — Telephone Encounter (Signed)
Pt states he was given  Myrbetriq 25 mg samples at his last visit and was advised to call office for refills if it worked for him. Pt is asking for refills to be sent to [facility]. Please advise. Thanks.

## 2019-01-28 NOTE — Telephone Encounter (Signed)
RX sent in

## 2019-04-29 ENCOUNTER — Ambulatory Visit (INDEPENDENT_AMBULATORY_CARE_PROVIDER_SITE_OTHER): Payer: Medicare Other | Admitting: Podiatry

## 2019-04-29 ENCOUNTER — Encounter: Payer: Self-pay | Admitting: Podiatry

## 2019-04-29 ENCOUNTER — Other Ambulatory Visit: Payer: Self-pay

## 2019-04-29 DIAGNOSIS — M79674 Pain in right toe(s): Secondary | ICD-10-CM

## 2019-04-29 DIAGNOSIS — B351 Tinea unguium: Secondary | ICD-10-CM | POA: Diagnosis not present

## 2019-04-29 DIAGNOSIS — E1159 Type 2 diabetes mellitus with other circulatory complications: Secondary | ICD-10-CM | POA: Insufficient documentation

## 2019-04-29 DIAGNOSIS — M79675 Pain in left toe(s): Secondary | ICD-10-CM

## 2019-04-29 NOTE — Progress Notes (Signed)
Complaint:  Visit Type: Patient returns to my office for  preventative foot care services. Complaint: Patient states" my nails have grown long and thick and become painful to walk and wear shoes" . The patient presents for preventative foot care services. No changes to ROS.  Patient is diabetic.  Podiatric Exam: Vascular: dorsalis pedis and posterior tibial pulses are weakly  palpable bilateral. Capillary return is immediate. Temperature gradient is WNL. Skin turgor WNL  Sensorium: Normal Semmes Weinstein monofilament test. Normal tactile sensation bilaterally. Nail Exam: Pt has thick disfigured discolored nails with subungual debris noted bilateral entire nail hallux. Ulcer Exam: There is no evidence of ulcer or pre-ulcerative changes or infection. Orthopedic Exam: Muscle tone and strength are WNL. No limitations in general ROM. No crepitus or effusions noted. Foot type and digits show no abnormalities. Bony prominences are unremarkable. Skin: No Porokeratosis. No infection or ulcers  Diagnosis:  Onychomycosis, , Pain in right toe, pain in left toes  Treatment & Plan Procedures and Treatment: Consent by patient was obtained for treatment procedures.   Debridement of mycotic and hypertrophic toenails, 1 through 5 bilateral and clearing of subungual debris. No ulceration, no infection noted.  Return Visit-Office Procedure: Patient instructed to return to the office for a follow up visit 3 months for continued evaluation and treatment.    Helane Gunther DPM

## 2019-07-29 ENCOUNTER — Other Ambulatory Visit: Payer: Self-pay

## 2019-07-29 ENCOUNTER — Ambulatory Visit (INDEPENDENT_AMBULATORY_CARE_PROVIDER_SITE_OTHER): Payer: Medicare Other | Admitting: Podiatry

## 2019-07-29 ENCOUNTER — Encounter: Payer: Self-pay | Admitting: Podiatry

## 2019-07-29 DIAGNOSIS — M79674 Pain in right toe(s): Secondary | ICD-10-CM | POA: Diagnosis not present

## 2019-07-29 DIAGNOSIS — M79675 Pain in left toe(s): Secondary | ICD-10-CM | POA: Diagnosis not present

## 2019-07-29 DIAGNOSIS — B351 Tinea unguium: Secondary | ICD-10-CM

## 2019-07-29 DIAGNOSIS — E1159 Type 2 diabetes mellitus with other circulatory complications: Secondary | ICD-10-CM | POA: Diagnosis not present

## 2019-07-29 NOTE — Progress Notes (Addendum)
This patient returns to my office for at risk foot care.  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  This patient is unable to cut nails himself since the patient cannot reach his nails.These nails are painful walking and wearing shoes.  This patient presents for at risk foot care today.  General Appearance  Alert, conversant and in no acute stress.  Vascular  Dorsalis pedis and posterior tibial  pulses are weakly  palpable  bilaterally.  Capillary return is within normal limits  bilaterally. Temperature is within normal limits  bilaterally.  Neurologic  Senn-Weinstein monofilament wire test within normal limits  bilaterally. Muscle power within normal limits bilaterally.  Nails Thick disfigured discolored nails with subungual debris  from hallux to fifth toes bilaterally. No evidence of bacterial infection or drainage bilaterally.  Orthopedic  No limitations of motion  feet .  No crepitus or effusions noted.  No bony pathology or digital deformities noted.  Skin  normotropic skin with no porokeratosis noted bilaterally.  No signs of infections or ulcers noted.     Onychomycosis  Pain in right toes  Pain in left toes  Consent was obtained for treatment procedures.   Mechanical debridement of nails 1-5  bilaterally performed with a nail nipper.  Filed with dremel without incident.    Return office visit   3  months                   Told patient to return for periodic foot care and evaluation due to potential at risk complications.   Shep Porter DPM  

## 2019-10-28 ENCOUNTER — Other Ambulatory Visit: Payer: Self-pay

## 2019-10-28 ENCOUNTER — Ambulatory Visit (INDEPENDENT_AMBULATORY_CARE_PROVIDER_SITE_OTHER): Payer: Medicare Other | Admitting: Podiatry

## 2019-10-28 ENCOUNTER — Encounter: Payer: Self-pay | Admitting: Podiatry

## 2019-10-28 DIAGNOSIS — L600 Ingrowing nail: Secondary | ICD-10-CM

## 2019-10-28 DIAGNOSIS — E1159 Type 2 diabetes mellitus with other circulatory complications: Secondary | ICD-10-CM

## 2019-10-28 DIAGNOSIS — M79675 Pain in left toe(s): Secondary | ICD-10-CM

## 2019-10-28 DIAGNOSIS — M79674 Pain in right toe(s): Secondary | ICD-10-CM

## 2019-10-28 DIAGNOSIS — B351 Tinea unguium: Secondary | ICD-10-CM | POA: Diagnosis not present

## 2019-10-28 NOTE — Progress Notes (Signed)
This patient returns to my office for at risk foot care.  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  This patient is unable to cut nails himself since the patient cannot reach his nails.These nails are painful walking and wearing shoes.  This patient presents for at risk foot care today.  General Appearance  Alert, conversant and in no acute stress.  Vascular  Dorsalis pedis and posterior tibial  pulses are weakly  palpable  bilaterally.  Capillary return is within normal limits  bilaterally. Temperature is within normal limits  bilaterally.  Neurologic  Senn-Weinstein monofilament wire test within normal limits  bilaterally. Muscle power within normal limits bilaterally.  Nails Thick disfigured discolored nails with subungual debris  from hallux to fifth toes bilaterally. No evidence of bacterial infection or drainage bilaterally.  Orthopedic  No limitations of motion  feet .  No crepitus or effusions noted.  No bony pathology or digital deformities noted.  Skin  normotropic skin with no porokeratosis noted bilaterally.  No signs of infections or ulcers noted.     Onychomycosis  Pain in right toes  Pain in left toes  Consent was obtained for treatment procedures.   Mechanical debridement of nails 1-5  bilaterally performed with a nail nipper.  Filed with dremel without incident.    Return office visit   3  months                   Told patient to return for periodic foot care and evaluation due to potential at risk complications.   Patty Lopezgarcia DPM  

## 2019-11-24 ENCOUNTER — Other Ambulatory Visit: Payer: Self-pay | Admitting: Urology

## 2020-01-06 ENCOUNTER — Ambulatory Visit (INDEPENDENT_AMBULATORY_CARE_PROVIDER_SITE_OTHER): Payer: Medicare Other | Admitting: Urology

## 2020-01-06 ENCOUNTER — Other Ambulatory Visit: Payer: Self-pay

## 2020-01-06 ENCOUNTER — Encounter: Payer: Self-pay | Admitting: Urology

## 2020-01-06 VITALS — BP 161/100 | HR 108 | Ht 72.0 in | Wt 196.0 lb

## 2020-01-06 DIAGNOSIS — N401 Enlarged prostate with lower urinary tract symptoms: Secondary | ICD-10-CM

## 2020-01-06 DIAGNOSIS — Z87898 Personal history of other specified conditions: Secondary | ICD-10-CM | POA: Diagnosis not present

## 2020-01-06 MED ORDER — MIRABEGRON ER 50 MG PO TB24: 50.0000 mg | ORAL_TABLET | Freq: Every day | ORAL | 3 refills | Status: DC

## 2020-01-06 MED ORDER — FINASTERIDE 5 MG PO TABS
5.0000 mg | ORAL_TABLET | Freq: Every day | ORAL | 3 refills | Status: DC
Start: 2020-01-06 — End: 2021-01-05

## 2020-01-06 NOTE — Progress Notes (Signed)
01/06/2020 2:36 PM   Rickey Martes [name] [birth year] [id number]  Referring provider: Lauro Regulus, MD [address] [facility] [place],  [place] [address]  Chief Complaint  Patient presents with  . Benign Prostatic Hypertrophy    Urologic history: 1.  BPH with lower urinary tract symptoms  -Finasteride daily  2.  History elevated PSA -Benign biopsy >10 years ago for PSA in upper 4 range  HPI: 86 y.o. male presents for annual follow-up.   At visit last year he was complaining of worsening urinary urgency with occasional urge incontinence  Myrbetriq added and he notes marked improvement of his storage elated voiding symptoms  Presently without bothersome LUTS; remains on finasteride  Denies dysuria, gross hematuria  Denies flank, abdominal or pelvic pain   PMH: Past Medical History:  Diagnosis Date  . Allergic rhinitis   . Colon polyps   . Essential hypertension, benign   . GERD (gastroesophageal reflux disease)   . Other and unspecified hyperlipidemia   . Pure hypercholesterolemia   . Type II or unspecified type diabetes mellitus without mention of complication, not stated as uncontrolled   . Unspecified essential hypertension   . Unspecified hypothyroidism     Surgical History: Past Surgical History:  Procedure Laterality Date  . FACIAL RECONSTRUCTION SURGERY     muscle surgery   . TONSILLECTOMY      Home Medications:  Allergies as of 01/06/2020      Reactions   Niacin Itching   Penicillins Rash   Rash & joint pain.  And joint pain And joint pain      Medication List       Accurate as of January 06, 2020  2:36 PM. If you have any questions, ask your nurse or doctor.        amLODipine 2.5 MG tablet Commonly known as: NORVASC   aspirin EC 81 MG tablet Take by mouth.   atorvastatin 20 MG tablet Commonly known as: LIPITOR TAKE 1 TABLET BY MOUTH ONCE DAILY   carvedilol 12.5 MG tablet Commonly known as:  COREG   finasteride 5 MG tablet Commonly known as: PROSCAR TAKE 1 TABLET BY MOUTH DAILY   levothyroxine 112 MCG tablet Commonly known as: SYNTHROID TAKE 1 TABLET BY MOUTH ONCE DAILY ON AN EMPTY STOMACH WITH A GLASS OF WATER AT LEAST 30 TO 60 MINUTES BEFORE BREAKFAST   lisinopril-hydrochlorothiazide 20-12.5 MG tablet Commonly known as: ZESTORETIC Take by mouth.   LYCOPENE PO Take by mouth.   metFORMIN 500 MG 24 hr tablet Commonly known as: GLUCOPHAGE-XR TAKE 1 TABLET BY MOUTH ONCE DAILY WITH DINNER   multivitamin tablet Take 1 tablet by mouth daily.   Myrbetriq 50 MG Tb24 tablet Generic drug: mirabegron ER TAKE ONE TABLET EVERY DAY   OneTouch Delica Lancets 33G Misc USE ONE LANCET TO CHECK GLUCOSE TWICE DAILY AS DIRECTED   OneTouch Ultra test strip Generic drug: glucose blood Use 2 (two) times daily.   potassium chloride SA 20 MEQ tablet Commonly known as: KLOR-CON TAKE 1 TABLET BY MOUTH DAILY       Allergies:  Allergies  Allergen Reactions  . Niacin Itching  . Penicillins Rash    Rash & joint pain.  And joint pain And joint pain     Family History: Family History  Problem Relation Age of Onset  . Heart attack Mother   . Heart disease Mother   . Hypertension Mother   . Hyperlipidemia Mother  Social History:  reports that he has never smoked. He has never used smokeless tobacco. He reports that he does not drink alcohol and does not use drugs.   Physical Exam: BP (!) 161/100   Pulse (!) 108   Ht 6' (1.829 m)   Wt 196 lb (88.9 kg)   BMI 26.58 kg/m   Constitutional:  Alert and oriented, No acute distress. HEENT: [place] AT, moist mucus membranes.  Trachea midline, no masses. Cardiovascular: No clubbing, cyanosis, or edema. Respiratory: Normal respiratory effort, no increased work of breathing.   Assessment & Plan:    1.  BPH with LUTS  Doing well on finasteride/Myrbetriq; refills sent  Continue annual follow-up  2.  History elevated  PSA  He has elected to discontinue PSA screening.   Riki Altes, MD  [facility] [address] [place], [place] [address] [phone]

## 2020-01-09 ENCOUNTER — Encounter: Payer: Self-pay | Admitting: Urology

## 2020-02-03 ENCOUNTER — Ambulatory Visit (INDEPENDENT_AMBULATORY_CARE_PROVIDER_SITE_OTHER): Payer: Medicare Other | Admitting: Podiatry

## 2020-02-03 ENCOUNTER — Other Ambulatory Visit: Payer: Self-pay

## 2020-02-03 ENCOUNTER — Encounter: Payer: Self-pay | Admitting: Podiatry

## 2020-02-03 DIAGNOSIS — M79675 Pain in left toe(s): Secondary | ICD-10-CM | POA: Diagnosis not present

## 2020-02-03 DIAGNOSIS — B351 Tinea unguium: Secondary | ICD-10-CM

## 2020-02-03 DIAGNOSIS — E1159 Type 2 diabetes mellitus with other circulatory complications: Secondary | ICD-10-CM

## 2020-02-03 DIAGNOSIS — M79674 Pain in right toe(s): Secondary | ICD-10-CM

## 2020-02-03 NOTE — Progress Notes (Signed)
This patient returns to my office for at risk foot care.  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  This patient is unable to cut nails himself since the patient cannot reach his nails.These nails are painful walking and wearing shoes.  This patient presents for at risk foot care today.  General Appearance  Alert, conversant and in no acute stress.  Vascular  Dorsalis pedis and posterior tibial  pulses are weakly  palpable  bilaterally.  Capillary return is within normal limits  bilaterally. Temperature is within normal limits  bilaterally.  Neurologic  Senn-Weinstein monofilament wire test within normal limits  bilaterally. Muscle power within normal limits bilaterally.  Nails Thick disfigured discolored nails with subungual debris  from hallux to fifth toes bilaterally. No evidence of bacterial infection or drainage bilaterally.  Orthopedic  No limitations of motion  feet .  No crepitus or effusions noted.  No bony pathology or digital deformities noted.  Skin  normotropic skin with no porokeratosis noted bilaterally.  No signs of infections or ulcers noted.     Onychomycosis  Pain in right toes  Pain in left toes  Consent was obtained for treatment procedures.   Mechanical debridement of nails 1-5  bilaterally performed with a nail nipper.  Filed with dremel without incident. During treatment he had a brown discoloration on fourth toe right foot.  The brown discoloration was removed with dremel usage.  DSD was applied.   Return office visit   3 months                   Told patient to return for periodic foot care and evaluation due to potential at risk complications.   Helane Gunther DPM

## 2020-05-11 ENCOUNTER — Encounter: Payer: Self-pay | Admitting: Podiatry

## 2020-05-11 ENCOUNTER — Ambulatory Visit (INDEPENDENT_AMBULATORY_CARE_PROVIDER_SITE_OTHER): Payer: Medicare Other | Admitting: Podiatry

## 2020-05-11 DIAGNOSIS — M79675 Pain in left toe(s): Secondary | ICD-10-CM

## 2020-05-11 DIAGNOSIS — E1159 Type 2 diabetes mellitus with other circulatory complications: Secondary | ICD-10-CM | POA: Diagnosis not present

## 2020-05-11 DIAGNOSIS — M79674 Pain in right toe(s): Secondary | ICD-10-CM | POA: Diagnosis not present

## 2020-05-11 DIAGNOSIS — B351 Tinea unguium: Secondary | ICD-10-CM | POA: Diagnosis not present

## 2020-05-11 NOTE — Progress Notes (Signed)
This patient returns to my office for at risk foot care.  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  This patient is unable to cut nails himself since the patient cannot reach his nails.These nails are painful walking and wearing shoes.  This patient presents for at risk foot care today.  General Appearance  Alert, conversant and in no acute stress.  Vascular  Dorsalis pedis and posterior tibial  pulses are weakly  palpable  bilaterally.  Capillary return is within normal limits  bilaterally. Temperature is within normal limits  bilaterally.  Neurologic  Senn-Weinstein monofilament wire test within normal limits  bilaterally. Muscle power within normal limits bilaterally.  Nails Thick disfigured discolored nails with subungual debris  from hallux to fifth toes bilaterally. No evidence of bacterial infection or drainage bilaterally.  Orthopedic  No limitations of motion  feet .  No crepitus or effusions noted.  No bony pathology or digital deformities noted.  Skin  normotropic skin with no porokeratosis noted bilaterally.  No signs of infections or ulcers noted.     Onychomycosis  Pain in right toes  Pain in left toes  Consent was obtained for treatment procedures.   Mechanical debridement of nails 1-5  bilaterally performed with a nail nipper.  Filed with dremel without incident. During treatment he had a brown discoloration on fourth toe right foot.  The brown discoloration was removed with dremel usage.   Return office visit   3 months                   Told patient to return for periodic foot care and evaluation due to potential at risk complications.   Helane Gunther DPM

## 2020-08-10 ENCOUNTER — Ambulatory Visit (INDEPENDENT_AMBULATORY_CARE_PROVIDER_SITE_OTHER): Payer: Medicare Other | Admitting: Podiatry

## 2020-08-10 ENCOUNTER — Encounter: Payer: Self-pay | Admitting: Podiatry

## 2020-08-10 ENCOUNTER — Other Ambulatory Visit: Payer: Self-pay

## 2020-08-10 DIAGNOSIS — B351 Tinea unguium: Secondary | ICD-10-CM | POA: Diagnosis not present

## 2020-08-10 DIAGNOSIS — M79674 Pain in right toe(s): Secondary | ICD-10-CM

## 2020-08-10 DIAGNOSIS — L600 Ingrowing nail: Secondary | ICD-10-CM

## 2020-08-10 DIAGNOSIS — M79675 Pain in left toe(s): Secondary | ICD-10-CM

## 2020-08-10 DIAGNOSIS — E1159 Type 2 diabetes mellitus with other circulatory complications: Secondary | ICD-10-CM | POA: Diagnosis not present

## 2020-08-10 NOTE — Progress Notes (Signed)
This patient returns to my office for at risk foot care.  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  This patient is unable to cut nails himself since the patient cannot reach his nails.These nails are painful walking and wearing shoes.  This patient presents for at risk foot care today.  General Appearance  Alert, conversant and in no acute stress.  Vascular  Dorsalis pedis and posterior tibial  pulses are weakly  palpable  bilaterally.  Capillary return is within normal limits  bilaterally. Temperature is within normal limits  bilaterally.  Neurologic  Senn-Weinstein monofilament wire test within normal limits  bilaterally. Muscle power within normal limits bilaterally.  Nails Thick disfigured discolored nails with subungual debris  from hallux to fifth toes bilaterally. No evidence of bacterial infection or drainage bilaterally.  Orthopedic  No limitations of motion  feet .  No crepitus or effusions noted.  No bony pathology or digital deformities noted.  Skin  normotropic skin with no porokeratosis noted bilaterally.  No signs of infections or ulcers noted.     Onychomycosis  Pain in right toes  Pain in left toes  Consent was obtained for treatment procedures.   Mechanical debridement of nails 1-5  bilaterally performed with a nail nipper.  Filed with dremel without incident.    Return office visit   4   months                   Told patient to return for periodic foot care and evaluation due to potential at risk complications.   Yanky Vanderburg DPM  

## 2020-12-14 ENCOUNTER — Encounter (INDEPENDENT_AMBULATORY_CARE_PROVIDER_SITE_OTHER): Payer: Self-pay

## 2020-12-14 ENCOUNTER — Other Ambulatory Visit: Payer: Self-pay

## 2020-12-14 ENCOUNTER — Encounter: Payer: Self-pay | Admitting: Podiatry

## 2020-12-14 ENCOUNTER — Ambulatory Visit (INDEPENDENT_AMBULATORY_CARE_PROVIDER_SITE_OTHER): Payer: Medicare Other | Admitting: Podiatry

## 2020-12-14 DIAGNOSIS — M79675 Pain in left toe(s): Secondary | ICD-10-CM

## 2020-12-14 DIAGNOSIS — E1159 Type 2 diabetes mellitus with other circulatory complications: Secondary | ICD-10-CM

## 2020-12-14 DIAGNOSIS — M79674 Pain in right toe(s): Secondary | ICD-10-CM | POA: Diagnosis not present

## 2020-12-14 DIAGNOSIS — B351 Tinea unguium: Secondary | ICD-10-CM | POA: Diagnosis not present

## 2020-12-14 NOTE — Progress Notes (Signed)
This patient returns to my office for at risk foot care.  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  This patient is unable to cut nails himself since the patient cannot reach his nails.These nails are painful walking and wearing shoes.  This patient presents for at risk foot care today.  General Appearance  Alert, conversant and in no acute stress.  Vascular  Dorsalis pedis and posterior tibial  pulses are weakly  palpable  bilaterally.  Capillary return is within normal limits  bilaterally. Temperature is within normal limits  bilaterally.  Neurologic  Senn-Weinstein monofilament wire test within normal limits  bilaterally. Muscle power within normal limits bilaterally.  Nails Thick disfigured discolored nails with subungual debris  from hallux to fifth toes bilaterally. No evidence of bacterial infection or drainage bilaterally.  Orthopedic  No limitations of motion  feet .  No crepitus or effusions noted.  No bony pathology or digital deformities noted.  Skin  normotropic skin with no porokeratosis noted bilaterally.  No signs of infections or ulcers noted.     Onychomycosis  Pain in right toes  Pain in left toes  Consent was obtained for treatment procedures.   Mechanical debridement of nails 1-5  bilaterally performed with a nail nipper.  Filed with dremel without incident.    Return office visit   3  months                   Told patient to return for periodic foot care and evaluation due to potential at risk complications.   Jolisa Intriago DPM  

## 2021-01-05 ENCOUNTER — Other Ambulatory Visit: Payer: Self-pay

## 2021-01-05 ENCOUNTER — Ambulatory Visit (INDEPENDENT_AMBULATORY_CARE_PROVIDER_SITE_OTHER): Payer: Medicare Other | Admitting: Urology

## 2021-01-05 ENCOUNTER — Encounter: Payer: Self-pay | Admitting: Urology

## 2021-01-05 VITALS — BP 151/78 | HR 40 | Ht 72.0 in | Wt 197.0 lb

## 2021-01-05 DIAGNOSIS — N401 Enlarged prostate with lower urinary tract symptoms: Secondary | ICD-10-CM

## 2021-01-05 DIAGNOSIS — N3941 Urge incontinence: Secondary | ICD-10-CM

## 2021-01-05 LAB — BLADDER SCAN AMB NON-IMAGING

## 2021-01-05 MED ORDER — FINASTERIDE 5 MG PO TABS: 5.0000 mg | ORAL_TABLET | Freq: Every day | ORAL | 3 refills | Status: DC

## 2021-01-05 MED ORDER — MIRABEGRON ER 50 MG PO TB24: 50.0000 mg | ORAL_TABLET | Freq: Every day | ORAL | 3 refills | Status: DC

## 2021-01-05 NOTE — Progress Notes (Signed)
01/05/2021 9:15 AM   Rickey Martes [name] 08/24/1929 [id number]  Referring provider: Lauro Regulus, MD [address] [facility] [place],  [place] [address]  Chief Complaint  Patient presents with   Follow-up    1 year follow-up, BPH    Urologic history: 1.  BPH with lower urinary tract symptoms  -Finasteride daily -Myrbetriq added 2020 for urgency/urge incontinence   2.  History elevated PSA -Benign biopsy >10 years ago for PSA in upper 4 range  HPI: 87 y.o. male presents for annual follow-up.  Doing well since last visit He stopped his Myrbetriq several months ago due to cost.  Symptoms remain good for a while but recently with increased frequency, urgency and urge incontinence Desires to restart Myrbetriq.  Remains on finasteride Denies dysuria, gross hematuria Denies flank, abdominal or pelvic pain   PMH: Past Medical History:  Diagnosis Date   Allergic rhinitis    Colon polyps    Essential hypertension, benign    GERD (gastroesophageal reflux disease)    Other and unspecified hyperlipidemia    Pure hypercholesterolemia    Type II or unspecified type diabetes mellitus without mention of complication, not stated as uncontrolled    Unspecified essential hypertension    Unspecified hypothyroidism     Surgical History: Past Surgical History:  Procedure Laterality Date   FACIAL RECONSTRUCTION SURGERY     muscle surgery    TONSILLECTOMY      Home Medications:  Allergies as of 01/05/2021       Reactions   Niacin Itching, Other (See Comments)   Penicillin G Rash   Penicillins Rash   Rash & joint pain.  And joint pain And joint pain        Medication List        Accurate as of January 05, 2021  9:15 AM. If you have any questions, ask your nurse or doctor.          alum & mag hydroxide-simeth 200-200-20 MG/5ML suspension Commonly known as: MAALOX/MYLANTA Take by mouth.   amLODipine 2.5 MG tablet Commonly known  as: NORVASC   amLODipine 5 MG tablet Commonly known as: NORVASC Take 5 mg by mouth daily.   aspirin EC 81 MG tablet Take by mouth.   atorvastatin 20 MG tablet Commonly known as: LIPITOR TAKE 1 TABLET BY MOUTH ONCE DAILY   carvedilol 12.5 MG tablet Commonly known as: COREG   clotrimazole-betamethasone cream Commonly known as: LOTRISONE Apply topically 2 (two) times daily.   finasteride 5 MG tablet Commonly known as: PROSCAR Take 1 tablet (5 mg total) by mouth daily.   furosemide 20 MG tablet Commonly known as: LASIX Take 20 mg by mouth daily as needed.   hydrochlorothiazide 25 MG tablet Commonly known as: HYDRODIURIL Take 1 tablet by mouth every morning.   levothyroxine 112 MCG tablet Commonly known as: SYNTHROID TAKE 1 TABLET BY MOUTH ONCE DAILY ON AN EMPTY STOMACH WITH A GLASS OF WATER AT LEAST 30 TO 60 MINUTES BEFORE BREAKFAST   levothyroxine 125 MCG tablet Commonly known as: SYNTHROID Take 125 mcg by mouth every morning.   lisinopril 40 MG tablet Commonly known as: ZESTRIL Take 1 tablet by mouth daily.   lisinopril-hydrochlorothiazide 20-12.5 MG tablet Commonly known as: ZESTORETIC Take by mouth.   LYCOPENE PO Take by mouth.   metFORMIN 500 MG 24 hr tablet Commonly known as: GLUCOPHAGE-XR TAKE 1 TABLET BY MOUTH ONCE DAILY WITH DINNER   metFORMIN 500 MG 24  hr tablet Commonly known as: GLUCOPHAGE-XR Take 2 tablets by mouth daily.   mirabegron ER 50 MG Tb24 tablet Commonly known as: Myrbetriq Take 1 tablet (50 mg total) by mouth daily.   multivitamin tablet Take 1 tablet by mouth daily.   OneTouch Delica Lancets 33G Misc USE ONE LANCET TO CHECK GLUCOSE TWICE DAILY AS DIRECTED   OneTouch Ultra test strip Generic drug: glucose blood Use 2 (two) times daily.   potassium chloride SA 20 MEQ tablet Commonly known as: KLOR-CON TAKE 1 TABLET BY MOUTH DAILY        Allergies:  Allergies  Allergen Reactions   Niacin Itching and Other (See  Comments)   Penicillin G Rash   Penicillins Rash    Rash & joint pain.  And joint pain And joint pain     Family History: Family History  Problem Relation Age of Onset   Heart attack Mother    Heart disease Mother    Hypertension Mother    Hyperlipidemia Mother     Social History:  reports that he has never smoked. He has never used smokeless tobacco. He reports that he does not drink alcohol and does not use drugs.   Physical Exam: BP (!) 151/78   Pulse (!) 40   Ht 6' (1.829 m)   Wt 197 lb (89.4 kg)   BMI 26.72 kg/m   Constitutional:  Alert and oriented, No acute distress. HEENT: [place] AT, moist mucus membranes.  Trachea midline, no masses. Cardiovascular: No clubbing, cyanosis, or edema. Respiratory: Normal respiratory effort, no increased work of breathing. Psychiatric: Normal mood and affect.   Assessment & Plan:    1.  BPH with LUTS Stable Finasteride refilled Continue annual follow-up  2.  Urge incontinence Recurrent storage related voiding symptoms after stopping Myrbetriq I offered him a trial of trospium though he desires to restart Myrbetriq and Rx sent to pharmacy Bladder scan PVR 0 mL   Riki Altes, MD  [facility] [address] [place], [place] [address] [phone]

## 2021-03-15 ENCOUNTER — Ambulatory Visit (INDEPENDENT_AMBULATORY_CARE_PROVIDER_SITE_OTHER): Payer: Medicare Other | Admitting: Podiatry

## 2021-03-15 ENCOUNTER — Other Ambulatory Visit: Payer: Self-pay

## 2021-03-15 DIAGNOSIS — E1159 Type 2 diabetes mellitus with other circulatory complications: Secondary | ICD-10-CM | POA: Diagnosis not present

## 2021-03-15 DIAGNOSIS — M79675 Pain in left toe(s): Secondary | ICD-10-CM | POA: Diagnosis not present

## 2021-03-15 DIAGNOSIS — B351 Tinea unguium: Secondary | ICD-10-CM

## 2021-03-15 DIAGNOSIS — M79674 Pain in right toe(s): Secondary | ICD-10-CM

## 2021-03-15 NOTE — Progress Notes (Signed)
This patient returns to my office for at risk foot care.  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  This patient is unable to cut nails himself since the patient cannot reach his nails.These nails are painful walking and wearing shoes.  This patient presents for at risk foot care today.  General Appearance  Alert, conversant and in no acute stress.  Vascular  Dorsalis pedis and posterior tibial  pulses are weakly  palpable  bilaterally.  Capillary return is within normal limits  bilaterally. Temperature is within normal limits  bilaterally.  Neurologic  Senn-Weinstein monofilament wire test within normal limits  bilaterally. Muscle power within normal limits bilaterally.  Nails Thick disfigured discolored nails with subungual debris  from hallux to fifth toes bilaterally. No evidence of bacterial infection or drainage bilaterally.  Orthopedic  No limitations of motion  feet .  No crepitus or effusions noted.  No bony pathology or digital deformities noted.  Skin  normotropic skin with no porokeratosis noted bilaterally.  No signs of infections or ulcers noted.     Onychomycosis  Pain in right toes  Pain in left toes  Consent was obtained for treatment procedures.   Mechanical debridement of nails 1-5  bilaterally performed with a nail nipper.  Filed with dremel without incident.    Return office visit   3  months                   Told patient to return for periodic foot care and evaluation due to potential at risk complications.   Nicholes Rough, DPM

## 2021-05-29 ENCOUNTER — Emergency Department
Admission: EM | Admit: 2021-05-29 | Discharge: 2021-05-30 | Disposition: A | Discharge status: Home / Self Care | Payer: Medicare Other | Attending: Emergency Medicine | Admitting: Emergency Medicine

## 2021-05-29 ENCOUNTER — Emergency Department: Payer: Medicare Other

## 2021-05-29 ENCOUNTER — Other Ambulatory Visit: Payer: Self-pay

## 2021-05-29 DIAGNOSIS — E1122 Type 2 diabetes mellitus with diabetic chronic kidney disease: Secondary | ICD-10-CM | POA: Diagnosis not present

## 2021-05-29 DIAGNOSIS — R0602 Shortness of breath: Secondary | ICD-10-CM | POA: Diagnosis not present

## 2021-05-29 DIAGNOSIS — R42 Dizziness and giddiness: Secondary | ICD-10-CM | POA: Diagnosis not present

## 2021-05-29 DIAGNOSIS — N189 Chronic kidney disease, unspecified: Secondary | ICD-10-CM | POA: Insufficient documentation

## 2021-05-29 DIAGNOSIS — I129 Hypertensive chronic kidney disease with stage 1 through stage 4 chronic kidney disease, or unspecified chronic kidney disease: Secondary | ICD-10-CM | POA: Diagnosis not present

## 2021-05-29 DIAGNOSIS — E039 Hypothyroidism, unspecified: Secondary | ICD-10-CM | POA: Diagnosis not present

## 2021-05-29 DIAGNOSIS — M546 Pain in thoracic spine: Secondary | ICD-10-CM | POA: Insufficient documentation

## 2021-05-29 DIAGNOSIS — R55 Syncope and collapse: Secondary | ICD-10-CM | POA: Insufficient documentation

## 2021-05-29 DIAGNOSIS — R112 Nausea with vomiting, unspecified: Secondary | ICD-10-CM | POA: Diagnosis not present

## 2021-05-29 LAB — CBC WITH DIFFERENTIAL/PLATELET
Abs Immature Granulocytes: 0.02 10*3/uL (ref 0.00–0.07)
Basophils Absolute: 0 10*3/uL (ref 0.0–0.1)
Basophils Relative: 0 %
Eosinophils Absolute: 0.1 10*3/uL (ref 0.0–0.5)
Eosinophils Relative: 2 %
HCT: 36.8 % — ABNORMAL LOW (ref 39.0–52.0)
Hemoglobin: 12.5 g/dL — ABNORMAL LOW (ref 13.0–17.0)
Immature Granulocytes: 0 %
Lymphocytes Relative: 35 %
Lymphs Abs: 2.2 10*3/uL (ref 0.7–4.0)
MCH: 33.2 pg (ref 26.0–34.0)
MCHC: 34 g/dL (ref 30.0–36.0)
MCV: 97.6 fL (ref 80.0–100.0)
Monocytes Absolute: 0.7 10*3/uL (ref 0.1–1.0)
Monocytes Relative: 10 %
Neutro Abs: 3.4 10*3/uL (ref 1.7–7.7)
Neutrophils Relative %: 53 %
Platelets: 188 10*3/uL (ref 150–400)
RBC: 3.77 MIL/uL — ABNORMAL LOW (ref 4.22–5.81)
RDW: 12.8 % (ref 11.5–15.5)
WBC: 6.4 10*3/uL (ref 4.0–10.5)
nRBC: 0 % (ref 0.0–0.2)

## 2021-05-29 LAB — BASIC METABOLIC PANEL WITH GFR
Anion gap: 7 (ref 5–15)
BUN: 20 mg/dL (ref 8–23)
CO2: 28 mmol/L (ref 22–32)
Calcium: 9.3 mg/dL (ref 8.9–10.3)
Chloride: 99 mmol/L (ref 98–111)
Creatinine, Ser: 0.86 mg/dL (ref 0.61–1.24)
GFR, Estimated: >60 mL/min
Glucose, Bld: 168 mg/dL — ABNORMAL HIGH (ref 70–99)
Potassium: 3.5 mmol/L (ref 3.5–5.1)
Sodium: 134 mmol/L — ABNORMAL LOW (ref 135–145)

## 2021-05-29 LAB — TROPONIN I (HIGH SENSITIVITY): Troponin I (High Sensitivity): 5 ng/L (ref <18)

## 2021-05-29 NOTE — ED Triage Notes (Signed)
Pt arrived via EMS c/o back pain that radiates to L arm/shoulder,  resolved on own, denies pain currently.

## 2021-05-29 NOTE — ED Provider Notes (Signed)
[facility] Provider Note    Event Date/Time   First MD Initiated Contact with Patient 05/29/21 2257     (approximate)   History   Back Pain   HPI  Rickey Vitali Tenbrink [name] is a 88 y.o. male who presents to the ED for evaluation of Back Pain   I review PCP visit from 11/21. Hx HTN, HLD, DM, CKD, hypothyroid.  Patient resides at home and cares for his wife.  He is functional in all of his ADLs.  Son lives a couple miles down the road and presents with him today.  Patient presents to the ED for evaluation of 3 episodes of back and arm pain that occurred in succession this evening.  Patient reports brief episode of sharp atraumatic left-sided back pain that radiated up and down his back, and then down his left arm when he was getting up from a recliner.  Lasting a matter of minutes before self resolving and without any associated symptoms, such as dizziness, nausea, emesis, shortness of breath, falls or syncope.  Reports a second episode while walking to the bathroom this evening a few minutes after the first, and then a third.  He reports each episode was of lesser intensity than the predecessor, but of a similar quality.  He reports feeling fine now and has no complaints.  He reports he has "a lot going on" at home related to his wife who has dementia and Alzheimer's disease.  He reports it is difficult to care for her and is under a lot of stress.  He questions if his pain may be related to this.   Physical Exam   Triage Vital Signs: ED Triage Vitals  Enc Vitals Group     BP 05/29/21 2254 (!) 161/80     Pulse Rate 05/29/21 2254 80     Resp 05/29/21 2254 16     Temp 05/29/21 2254 98.4 F (36.9 C)     Temp Source 05/29/21 2254 Oral     SpO2 05/29/21 2254 100 %     Weight 05/29/21 2255 180 lb (81.6 kg)     Height 05/29/21 2255 6' (1.829 m)     Head Circumference --      Peak Flow --      Pain Score 05/29/21 2255 0     Pain Loc --      Pain Edu? --       Excl. in GC? --     Most recent vital signs: Vitals:   05/30/21 0030 05/30/21 0130  BP: (!) 141/87 (!) 145/67  Pulse: 71 75  Resp: 17 16  Temp:    SpO2: 96% 96%    General: Awake, no distress.  Pleasant and conversational.  Follows commands in all 4. CV:  Good peripheral perfusion.  Resp:  Normal effort.  Abd:  No distention.  MSK:  No deformity noted.  Trace pitting edema to bilateral lower extremities without overlying skin changes.  Reportedly chronic. No skin changes or signs of trauma to the back.  Nontender spine and paraspinal spine around his site of previous pain. Nontender left shoulder and arm.  Neuro:  No focal deficits appreciated. Cranial nerves II through XII intact 5/5 strength and sensation in all 4 extremities Other:     ED Results / Procedures / Treatments   Labs (all labs ordered are listed, but only abnormal results are displayed) Labs Reviewed  CBC WITH DIFFERENTIAL/PLATELET - Abnormal; Notable for the following components:  Result Value   RBC 3.77 (*)    Hemoglobin 12.5 (*)    HCT 36.8 (*)    All other components within normal limits  BASIC METABOLIC PANEL - Abnormal; Notable for the following components:   Sodium 134 (*)    Glucose, Bld 168 (*)    All other components within normal limits  TROPONIN I (HIGH SENSITIVITY)  TROPONIN I (HIGH SENSITIVITY)    EKG Sinus rhythm with frequent PACs, some in couplets.  Normal axis and intervals.  Early R wave progression.  No evidence of acute ischemia.  RADIOLOGY CXR reviewed by me without evidence of acute cardiopulmonary pathology.  Official radiology report(s): DG Chest 2 View  Result Date: 05/29/2021 CLINICAL DATA:  Back pain radiating to left shoulder EXAM: CHEST - 2 VIEW COMPARISON:  07/13/2013 FINDINGS: Frontal and lateral views of the chest demonstrate an unremarkable cardiac silhouette. Stable ectasia of the thoracic aorta. No acute airspace disease, effusion, or pneumothorax. No acute  bony abnormality. IMPRESSION: 1. No acute intrathoracic process. Electronically Signed   By: Sharlet Salina M.D.   On: 05/29/2021 23:27    PROCEDURES and INTERVENTIONS:  .1-3 Lead EKG Interpretation Performed by: Delton Prairie, MD Authorized by: Delton Prairie, MD     Interpretation: normal     ECG rate:  68   ECG rate assessment: normal     Rhythm: sinus rhythm     Ectopy: PAC     Conduction: normal    Medications - No data to display   IMPRESSION / MDM / ASSESSMENT AND PLAN / ED COURSE  I reviewed the triage vital signs and the nursing notes.  Pleasant and functional 89 year old male presents to the ED after an episode of atypical left-sided back and arm pain, without evidence of acute pathology and suitable for outpatient management.  He looks clinically well to me.  He is asymptomatic here in the ED with a normal neurologic and vascular examination.  No rash or signs of herpes zoster.  Nonischemic EKG and 2 negative troponins making ACS unlikely.  Unlikely PE.  Blood work is benign with normal metabolic panel and CBC.  CXR without infiltrate, PTX or signs of aortic enlargement to suggest aneurysm or dissection.  I considered observation admission for chest pain, but he declines.  We discussed return precautions.  Clinical Course as of 05/30/21 0243  Tue May 29, 2021  2318 I chat with patient's son at the bedside while patient is in XR [DS]  2359 Discussed plan of care with the patient and son.  We discussed the process of obtaining a second troponin and disposition thereafter.  We discussed the possibility of observation admission due to his atypical pains. [DS]  Wed May 30, 2021  0243 Reassessed.  Still feeling well.  No recurrence of pain.  He requests discharge.  I offered observation admission but he declines.  We discussed return precautions. [DS]    Clinical Course User Index [DS] Delton Prairie, MD     FINAL CLINICAL IMPRESSION(S) / ED DIAGNOSES   Final diagnoses:  Acute  left-sided thoracic back pain     Rx / DC Orders   ED Discharge Orders     None        Note:  This document was prepared using Dragon voice recognition software and may include unintentional dictation errors.   Delton Prairie, MD 05/30/21 [phone]

## 2021-05-30 LAB — TROPONIN I (HIGH SENSITIVITY): Troponin I (High Sensitivity): 6 ng/L (ref <18)

## 2021-05-30 NOTE — Discharge Instructions (Signed)
Use Tylenol for pain and fevers.  Up to 1000 mg per dose, up to 4 times per day.  Do not take more than 4000 mg of Tylenol/acetaminophen within 24 hours..  

## 2021-06-21 ENCOUNTER — Other Ambulatory Visit: Payer: Self-pay

## 2021-06-21 ENCOUNTER — Ambulatory Visit (INDEPENDENT_AMBULATORY_CARE_PROVIDER_SITE_OTHER): Payer: Medicare Other | Admitting: Podiatry

## 2021-06-21 ENCOUNTER — Encounter: Payer: Self-pay | Admitting: Podiatry

## 2021-06-21 DIAGNOSIS — M79674 Pain in right toe(s): Secondary | ICD-10-CM

## 2021-06-21 DIAGNOSIS — M79675 Pain in left toe(s): Secondary | ICD-10-CM

## 2021-06-21 DIAGNOSIS — B351 Tinea unguium: Secondary | ICD-10-CM

## 2021-06-21 DIAGNOSIS — E1159 Type 2 diabetes mellitus with other circulatory complications: Secondary | ICD-10-CM | POA: Diagnosis not present

## 2021-06-21 NOTE — Progress Notes (Signed)
This patient returns to my office for at risk foot care.  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  This patient is unable to cut nails himself since the patient cannot reach his nails.These nails are painful walking and wearing shoes.  This patient presents for at risk foot care today.  General Appearance  Alert, conversant and in no acute stress.  Vascular  Dorsalis pedis and posterior tibial  pulses are weakly  palpable  bilaterally.  Capillary return is within normal limits  bilaterally. Temperature is within normal limits  bilaterally.  Neurologic  Senn-Weinstein monofilament wire test within normal limits  bilaterally. Muscle power within normal limits bilaterally.  Nails Thick disfigured discolored nails with subungual debris  from hallux to fifth toes bilaterally. No evidence of bacterial infection or drainage bilaterally.  Orthopedic  No limitations of motion  feet .  No crepitus or effusions noted.  No bony pathology or digital deformities noted.  Skin  normotropic skin with no porokeratosis noted bilaterally.  No signs of infections or ulcers noted.     Onychomycosis  Pain in right toes  Pain in left toes  Consent was obtained for treatment procedures.   Mechanical debridement of nails 1-5  bilaterally performed with a nail nipper.  Filed with dremel without incident.    Return office visit   3  months                   Told patient to return for periodic foot care and evaluation due to potential at risk complications.   Josy Peaden DPM  

## 2021-09-20 ENCOUNTER — Encounter: Payer: Self-pay | Admitting: Podiatry

## 2021-09-20 ENCOUNTER — Ambulatory Visit (INDEPENDENT_AMBULATORY_CARE_PROVIDER_SITE_OTHER): Payer: Medicare Other | Admitting: Podiatry

## 2021-09-20 DIAGNOSIS — M79674 Pain in right toe(s): Secondary | ICD-10-CM

## 2021-09-20 DIAGNOSIS — M79675 Pain in left toe(s): Secondary | ICD-10-CM | POA: Diagnosis not present

## 2021-09-20 DIAGNOSIS — E1159 Type 2 diabetes mellitus with other circulatory complications: Secondary | ICD-10-CM | POA: Diagnosis not present

## 2021-09-20 DIAGNOSIS — B351 Tinea unguium: Secondary | ICD-10-CM

## 2021-09-20 NOTE — Progress Notes (Signed)
This patient returns to my office for at risk foot care.  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  This patient is unable to cut nails himself since the patient cannot reach his nails.These nails are painful walking and wearing shoes.  This patient presents for at risk foot care today.  General Appearance  Alert, conversant and in no acute stress.  Vascular  Dorsalis pedis and posterior tibial  pulses are weakly  palpable  bilaterally.  Capillary return is within normal limits  bilaterally. Temperature is within normal limits  bilaterally.  Neurologic  Senn-Weinstein monofilament wire test within normal limits  bilaterally. Muscle power within normal limits bilaterally.  Nails Thick disfigured discolored nails with subungual debris  from hallux to fifth toes bilaterally. No evidence of bacterial infection or drainage bilaterally.  Orthopedic  No limitations of motion  feet .  No crepitus or effusions noted.  No bony pathology or digital deformities noted.  Skin  normotropic skin with no porokeratosis noted bilaterally.  No signs of infections or ulcers noted.     Onychomycosis  Pain in right toes  Pain in left toes  Consent was obtained for treatment procedures.   Mechanical debridement of nails 1-5  bilaterally performed with a nail nipper.  Filed with dremel without incident.    Return office visit   4   months                   Told patient to return for periodic foot care and evaluation due to potential at risk complications.   Helane Gunther DPM

## 2021-12-25 ENCOUNTER — Other Ambulatory Visit: Payer: Self-pay | Admitting: Urology

## 2022-01-04 ENCOUNTER — Encounter: Payer: Self-pay | Admitting: Urology

## 2022-01-04 ENCOUNTER — Ambulatory Visit (INDEPENDENT_AMBULATORY_CARE_PROVIDER_SITE_OTHER): Payer: Medicare Other | Admitting: Urology

## 2022-01-04 VITALS — BP 146/83 | HR 103 | Ht 71.0 in | Wt 192.0 lb

## 2022-01-04 DIAGNOSIS — N401 Enlarged prostate with lower urinary tract symptoms: Secondary | ICD-10-CM

## 2022-01-04 DIAGNOSIS — N3941 Urge incontinence: Secondary | ICD-10-CM | POA: Diagnosis not present

## 2022-01-04 LAB — BLADDER SCAN AMB NON-IMAGING: Scan Result: 0

## 2022-01-04 MED ORDER — MIRABEGRON ER 50 MG PO TB24: 50.0000 mg | ORAL_TABLET | Freq: Every day | ORAL | 3 refills | Status: DC

## 2022-01-04 MED ORDER — FINASTERIDE 5 MG PO TABS: 5.0000 mg | ORAL_TABLET | Freq: Every day | ORAL | 3 refills | Status: DC

## 2022-01-04 NOTE — Progress Notes (Signed)
01/04/2022 9:33 AM   Rickey Severance [name] Mar 06, 1930 [id number]  Referring provider: Kirk Ruths, MD [place] [facility] [place],  [place] [address]  Chief Complaint  Patient presents with   Benign Prostatic Hypertrophy    Urologic history: 1.  BPH with lower urinary tract symptoms  -Finasteride daily - Myrbetriq added 2020 for urgency/urge incontinence   2.  History elevated PSA - Benign biopsy >10 years ago for PSA in upper 4 range  HPI: 88 y.o. male presents for annual follow-up.  Doing well since last visit Stable LUTS on finasteride and Myrbetriq Denies dysuria, gross hematuria Denies flank, abdominal or pelvic pain   PMH: Past Medical History:  Diagnosis Date   Allergic rhinitis    Colon polyps    Essential hypertension, benign    GERD (gastroesophageal reflux disease)    Other and unspecified hyperlipidemia    Pure hypercholesterolemia    Type II or unspecified type diabetes mellitus without mention of complication, not stated as uncontrolled    Unspecified essential hypertension    Unspecified hypothyroidism     Surgical History: Past Surgical History:  Procedure Laterality Date   FACIAL RECONSTRUCTION SURGERY     muscle surgery    TONSILLECTOMY      Home Medications:  Allergies as of 01/04/2022       Reactions   Niacin Itching, Other (See Comments)   Penicillin G Rash   Penicillins Rash   Rash & joint pain.  And joint pain And joint pain        Medication List        Accurate as of January 04, 2022  9:33 AM. If you have any questions, ask your nurse or doctor.          alum & mag hydroxide-simeth 200-200-20 MG/5ML suspension Commonly known as: MAALOX/MYLANTA Take by mouth.   amLODipine 2.5 MG tablet Commonly known as: NORVASC   amLODipine 5 MG tablet Commonly known as: NORVASC Take 5 mg by mouth daily.   aspirin EC 81 MG tablet Take by mouth.   atorvastatin 20 MG tablet Commonly  known as: LIPITOR TAKE 1 TABLET BY MOUTH ONCE DAILY   carvedilol 12.5 MG tablet Commonly known as: COREG   clotrimazole-betamethasone cream Commonly known as: LOTRISONE Apply topically 2 (two) times daily.   finasteride 5 MG tablet Commonly known as: PROSCAR Take 1 tablet (5 mg total) by mouth daily.   furosemide 20 MG tablet Commonly known as: LASIX Take 20 mg by mouth daily as needed.   hydrochlorothiazide 25 MG tablet Commonly known as: HYDRODIURIL Take 1 tablet by mouth every morning.   levothyroxine 112 MCG tablet Commonly known as: SYNTHROID TAKE 1 TABLET BY MOUTH ONCE DAILY ON AN EMPTY STOMACH WITH A GLASS OF WATER AT LEAST 30 TO 60 MINUTES BEFORE BREAKFAST   levothyroxine 125 MCG tablet Commonly known as: SYNTHROID Take 125 mcg by mouth every morning.   lisinopril 40 MG tablet Commonly known as: ZESTRIL Take 1 tablet by mouth daily.   lisinopril-hydrochlorothiazide 20-12.5 MG tablet Commonly known as: ZESTORETIC Take by mouth.   LYCOPENE PO Take by mouth.   metFORMIN 500 MG 24 hr tablet Commonly known as: GLUCOPHAGE-XR TAKE 1 TABLET BY MOUTH ONCE DAILY WITH DINNER   metFORMIN 500 MG 24 hr tablet Commonly known as: GLUCOPHAGE-XR Take 2 tablets by mouth daily.   multivitamin tablet Take 1 tablet by mouth daily.   Myrbetriq 50 MG Tb24 tablet Generic drug:  mirabegron ER TAKE ONE TABLET BY MOUTH DAILY   OneTouch Delica Lancets 33G Misc USE ONE LANCET TO CHECK GLUCOSE TWICE DAILY AS DIRECTED   OneTouch Ultra test strip Generic drug: glucose blood Use 2 (two) times daily.   potassium chloride SA 20 MEQ tablet Commonly known as: KLOR-CON M TAKE 1 TABLET BY MOUTH DAILY        Allergies:  Allergies  Allergen Reactions   Niacin Itching and Other (See Comments)   Penicillin G Rash   Penicillins Rash    Rash & joint pain.  And joint pain And joint pain     Family History: Family History  Problem Relation Age of Onset   Heart attack  Mother    Heart disease Mother    Hypertension Mother    Hyperlipidemia Mother     Social History:  reports that he has never smoked. He has never used smokeless tobacco. He reports that he does not drink alcohol and does not use drugs.   Physical Exam: BP (!) 146/83   Pulse (!) 103   Ht 5\' 11"  (1.803 m)   Wt 192 lb (87.1 kg)   BMI 26.78 kg/m   Constitutional:  Alert and oriented, No acute distress. HEENT: [place] AT, moist mucus membranes.  Trachea midline, no masses. Cardiovascular: No clubbing, cyanosis, or edema. Respiratory: Normal respiratory effort, no increased work of breathing. Psychiatric: Normal mood and affect.   Assessment & Plan:    1.  BPH with LUTS Stable Finasteride refilled Continue annual follow-up  2.  Urge incontinence Stable on Myrbetriq which was refilled Bladder scan PVR 0 mL   , MD  [facility] [address], [address] [place], [place] [phone]

## 2022-01-24 ENCOUNTER — Encounter: Payer: Self-pay | Admitting: Podiatry

## 2022-01-24 ENCOUNTER — Ambulatory Visit (INDEPENDENT_AMBULATORY_CARE_PROVIDER_SITE_OTHER): Payer: Medicare Other | Admitting: Podiatry

## 2022-01-24 DIAGNOSIS — E1159 Type 2 diabetes mellitus with other circulatory complications: Secondary | ICD-10-CM

## 2022-01-24 DIAGNOSIS — B351 Tinea unguium: Secondary | ICD-10-CM

## 2022-01-24 DIAGNOSIS — M79675 Pain in left toe(s): Secondary | ICD-10-CM | POA: Diagnosis not present

## 2022-01-24 DIAGNOSIS — M79674 Pain in right toe(s): Secondary | ICD-10-CM

## 2022-01-24 NOTE — Progress Notes (Signed)
This patient returns to my office for at risk foot care.  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  This patient is unable to cut nails himself since the patient cannot reach his nails.These nails are painful walking and wearing shoes.  This patient presents for at risk foot care today.  General Appearance  Alert, conversant and in no acute stress.  Vascular  Dorsalis pedis and posterior tibial  pulses are weakly  palpable  bilaterally.  Capillary return is within normal limits  bilaterally. Temperature is within normal limits  bilaterally.  Neurologic  Senn-Weinstein monofilament wire test within normal limits  bilaterally. Muscle power within normal limits bilaterally.  Nails Thick disfigured discolored nails with subungual debris  from hallux to fifth toes bilaterally. No evidence of bacterial infection or drainage bilaterally.  Orthopedic  No limitations of motion  feet .  No crepitus or effusions noted.  No bony pathology or digital deformities noted.  Skin  normotropic skin with no porokeratosis noted bilaterally.  No signs of infections or ulcers noted.     Onychomycosis  Pain in right toes  Pain in left toes  Consent was obtained for treatment procedures.   Mechanical debridement of nails 1-5  bilaterally performed with a nail nipper.  Filed with dremel without incident.    Return office visit   3  months                   Told patient to return for periodic foot care and evaluation due to potential at risk complications.   Gardiner Barefoot DPM

## 2022-05-02 ENCOUNTER — Ambulatory Visit: Payer: Medicare Other | Admitting: Podiatry

## 2022-05-24 ENCOUNTER — Ambulatory Visit (INDEPENDENT_AMBULATORY_CARE_PROVIDER_SITE_OTHER): Payer: Medicare Other | Admitting: Podiatry

## 2022-05-24 DIAGNOSIS — M79675 Pain in left toe(s): Secondary | ICD-10-CM | POA: Diagnosis not present

## 2022-05-24 DIAGNOSIS — B351 Tinea unguium: Secondary | ICD-10-CM | POA: Diagnosis not present

## 2022-05-24 DIAGNOSIS — M79674 Pain in right toe(s): Secondary | ICD-10-CM | POA: Diagnosis not present

## 2022-05-24 DIAGNOSIS — E1159 Type 2 diabetes mellitus with other circulatory complications: Secondary | ICD-10-CM

## 2022-05-24 NOTE — Progress Notes (Signed)
This patient returns to my office for at risk foot care.  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  This patient is unable to cut nails himself since the patient cannot reach his nails.These nails are painful walking and wearing shoes.  This patient presents for at risk foot care today.  General Appearance  Alert, conversant and in no acute stress.  Vascular  Dorsalis pedis and posterior tibial  pulses are weakly  palpable  bilaterally.  Capillary return is within normal limits  bilaterally. Temperature is within normal limits  bilaterally.  Neurologic  Senn-Weinstein monofilament wire test within normal limits  bilaterally. Muscle power within normal limits bilaterally.  Nails Thick disfigured discolored nails with subungual debris  from hallux to fifth toes bilaterally. No evidence of bacterial infection or drainage bilaterally.  Orthopedic  No limitations of motion  feet .  No crepitus or effusions noted.  No bony pathology or digital deformities noted.  Skin  normotropic skin with no porokeratosis noted bilaterally.  No signs of infections or ulcers noted.     Onychomycosis  Pain in right toes  Pain in left toes  Consent was obtained for treatment procedures.   Mechanical debridement of nails 1-5  bilaterally performed with a nail nipper.  Filed with dremel without incident.    Return office visit   3  months                   Told patient to return for periodic foot care and evaluation due to potential at risk complications.   Boneta Lucks DPM

## 2022-08-22 ENCOUNTER — Ambulatory Visit (INDEPENDENT_AMBULATORY_CARE_PROVIDER_SITE_OTHER): Payer: Medicare Other | Admitting: Podiatry

## 2022-08-22 DIAGNOSIS — S90222A Contusion of left lesser toe(s) with damage to nail, initial encounter: Secondary | ICD-10-CM

## 2022-08-22 DIAGNOSIS — E119 Type 2 diabetes mellitus without complications: Secondary | ICD-10-CM

## 2022-08-22 DIAGNOSIS — M79674 Pain in right toe(s): Secondary | ICD-10-CM | POA: Diagnosis not present

## 2022-08-22 DIAGNOSIS — M79675 Pain in left toe(s): Secondary | ICD-10-CM | POA: Diagnosis not present

## 2022-08-22 DIAGNOSIS — E1159 Type 2 diabetes mellitus with other circulatory complications: Secondary | ICD-10-CM | POA: Diagnosis not present

## 2022-08-22 DIAGNOSIS — B351 Tinea unguium: Secondary | ICD-10-CM

## 2022-08-26 ENCOUNTER — Encounter: Payer: Self-pay | Admitting: Podiatry

## 2022-08-26 NOTE — Progress Notes (Signed)
ANNUAL DIABETIC FOOT EXAM  Subjective: Rickey Niemi Voong [name] presents today annual diabetic foot exam.  Chief Complaint  Patient presents with   Nail Problem    RFC,Referring Provider Lauro Regulus, MD,LOV:4/24,A1C:7.8,B/S:unknown      Patient confirms h/o diabetes.  Patient denies any h/o foot wounds.   Patient endorses symptoms of foot tingling.  Risk factors: diabetes, HTN, hyperlipidemia, hypercholesterolemia.  Lauro Regulus, MD is patient's PCP.  Past Medical History:  Diagnosis Date   Allergic rhinitis    Colon polyps    Essential hypertension, benign    GERD (gastroesophageal reflux disease)    Other and unspecified hyperlipidemia    Pure hypercholesterolemia    Type II or unspecified type diabetes mellitus without mention of complication, not stated as uncontrolled    Unspecified essential hypertension    Unspecified hypothyroidism    Patient Active Problem List   Diagnosis Date Noted   Type 2 diabetes mellitus with vascular disease (HCC) 04/29/2019   History of elevated PSA 12/13/2017   Benign prostatic hyperplasia with lower urinary tract symptoms 12/13/2017   Health care maintenance 03/15/2015   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) 08/29/2013   Hyperlipidemia associated with type 2 diabetes mellitus (HCC) 08/29/2013   Hypertensive left ventricular hypertrophy, without heart failure 08/29/2013   Flatulence/gas pain/belching 07/22/2013   HTN (hypertension) 07/22/2013   Shoulder pain 07/22/2013   Hyperlipidemia 07/22/2013   Hypothyroid 07/22/2013   GERD (gastroesophageal reflux disease) 07/22/2013   ED (erectile dysfunction) of organic origin 05/13/2012   Family history of malignant neoplasm of prostate 05/13/2012   Sciatica 05/13/2012   Past Surgical History:  Procedure Laterality Date   FACIAL RECONSTRUCTION SURGERY     muscle surgery    TONSILLECTOMY     Current Outpatient Medications on File Prior to Visit   Medication Sig Dispense Refill   alum & mag hydroxide-simeth (MAALOX/MYLANTA) 200-200-20 MG/5ML suspension Take by mouth.     amLODipine (NORVASC) 2.5 MG tablet      amLODipine (NORVASC) 5 MG tablet Take 5 mg by mouth daily.     aspirin EC 81 MG tablet Take by mouth.     atorvastatin (LIPITOR) 20 MG tablet TAKE 1 TABLET BY MOUTH ONCE DAILY     carvedilol (COREG) 12.5 MG tablet   3   clotrimazole-betamethasone (LOTRISONE) cream Apply topically 2 (two) times daily.     finasteride (PROSCAR) 5 MG tablet Take 1 tablet (5 mg total) by mouth daily. 90 tablet 3   furosemide (LASIX) 20 MG tablet Take 20 mg by mouth daily as needed.     glucose blood (ONETOUCH ULTRA) test strip Use 2 (two) times daily.     hydrochlorothiazide (HYDRODIURIL) 25 MG tablet Take 1 tablet by mouth every morning.     levothyroxine (SYNTHROID) 125 MCG tablet Take 125 mcg by mouth every morning.     levothyroxine (SYNTHROID, LEVOTHROID) 112 MCG tablet TAKE 1 TABLET BY MOUTH ONCE DAILY ON AN EMPTY STOMACH WITH A GLASS OF WATER AT LEAST 30 TO 60 MINUTES BEFORE BREAKFAST  11   lisinopril (ZESTRIL) 40 MG tablet Take 1 tablet by mouth daily.     lisinopril-hydrochlorothiazide (ZESTORETIC) 20-12.5 MG tablet Take by mouth.     LYCOPENE PO Take by mouth.     metFORMIN (GLUCOPHAGE-XR) 500 MG 24 hr tablet Take 2 tablets by mouth daily.     mirabegron ER (MYRBETRIQ) 50 MG TB24 tablet Take 1 tablet (50 mg total) by mouth daily. 90  tablet 3   Multiple Vitamin (MULTIVITAMIN) tablet Take 1 tablet by mouth daily.     OneTouch Delica Lancets 33G MISC USE ONE LANCET TO CHECK GLUCOSE TWICE DAILY AS DIRECTED     potassium chloride SA (KLOR-CON) 20 MEQ tablet TAKE 1 TABLET BY MOUTH DAILY     No current facility-administered medications on file prior to visit.    Allergies  Allergen Reactions   Niacin Itching and Other (See Comments)   Penicillin G Rash   Penicillins Rash    Rash & joint pain.  And joint pain And joint pain    Social  History   Occupational History   Not on file  Tobacco Use   Smoking status: Never   Smokeless tobacco: Never  Substance and Sexual Activity   Alcohol use: No   Drug use: No   Sexual activity: Yes    Birth control/protection: None   Family History  Problem Relation Age of Onset   Heart attack Mother    Heart disease Mother    Hypertension Mother    Hyperlipidemia Mother    Immunization History  Administered Date(s) Administered   Influenza Split 01/06/2015   Influenza, High Dose Seasonal PF 12/30/2016   Influenza,inj,Quad PF,6+ Mos 12/20/2019   Influenza-Unspecified 12/31/2013, 12/22/2015, 12/20/2016, 01/02/2018, 12/18/2018   PFIZER Comirnaty(Gray Top)Covid-19 Tri-Sucrose Vaccine 04/12/2019, 05/03/2019, 12/31/2019   PFIZER(Purple Top)SARS-COV-2 Vaccination 04/12/2019, 05/03/2019, 12/31/2019   Zoster Recombinat (Shingrix) 03/27/2018, 06/12/2018     Review of Systems: Negative except as noted in the HPI.   Objective: There were no vitals filed for this visit.  Rickey Therriault Wageman [name] is a pleasant 89 y.o. male in NAD. AAO X 3.  Vascular Examination: CFT <3 seconds b/l. DP/PT pulses faintly palpable b/l. Skin temperature gradient warm to warm b/l. No pain with calf compression. No ischemia or gangrene. No cyanosis or clubbing noted b/l. No edema noted b/l LE.   Neurological Examination: Sensation grossly intact b/l with 10 gram monofilament. Vibratory sensation intact b/l.   Dermatological Examination: Pedal skin warm and supple b/l.   No open wounds. No interdigital macerations. Subacute subungual hematoma left 2nd digit. Nailplate remains adhered. No erythema, no edema, no drainage, no fluctuance.  Toenails 1-5 b/l thick, discolored, elongated with subungual debris and pain on dorsal palpation.    No hyperkeratotic nor porokeratotic lesions present on today's visit.  Musculoskeletal Examination: Muscle strength 5/5 to all lower extremity muscle groups bilaterally.  No pain, crepitus or joint limitation noted with ROM bilateral LE. No gross bony deformities bilaterally. Patient ambulates independent of any assistive aids.  Radiographs: None  ADA Risk Categorization: Low Risk :  Patient has all of the following: Intact protective sensation No prior foot ulcer  No severe deformity Pedal pulses present  Assessment: 1. Pain due to onychomycosis of toenails of both feet   2. Subungual hematoma of lesser toe of left foot   3. Type 2 diabetes mellitus with vascular disease (HCC)   4. Encounter for diabetic foot exam [facility])     Plan: -Patient was evaluated and treated. All patient's and/or POA's questions/concerns answered on today's visit. -Subungual hematoma left 2nd toe stable. Monitor. -Diabetic foot examination performed today. -Continue diabetic foot care principles: inspect feet daily, monitor glucose as recommended by PCP and/or Endocrinologist, and follow prescribed diet per PCP, Endocrinologist and/or dietician. -Toenails 1-5 b/l were debrided in length and girth with sterile nail nippers and dremel without iatrogenic bleeding.  -Patient/POA to call should there be question/concern in the  interim. Return in about 3 months (around 11/22/2022).  Freddie Breech, DPM

## 2022-11-21 ENCOUNTER — Other Ambulatory Visit: Payer: Self-pay | Admitting: Urology

## 2022-11-28 ENCOUNTER — Ambulatory Visit: Payer: Medicare Other | Admitting: Podiatry

## 2022-11-28 DIAGNOSIS — M79674 Pain in right toe(s): Secondary | ICD-10-CM

## 2022-11-28 DIAGNOSIS — M79675 Pain in left toe(s): Secondary | ICD-10-CM | POA: Diagnosis not present

## 2022-11-28 DIAGNOSIS — B351 Tinea unguium: Secondary | ICD-10-CM

## 2022-11-28 DIAGNOSIS — E1159 Type 2 diabetes mellitus with other circulatory complications: Secondary | ICD-10-CM

## 2022-12-01 ENCOUNTER — Encounter: Payer: Self-pay | Admitting: Podiatry

## 2022-12-01 NOTE — Progress Notes (Signed)
  Subjective:  Patient ID: Rickey Martes [name], male    DOB: Jul 13, 1929,  MRN: [id number]  Rickey Sep Labreck [name] presents to clinic today for at risk foot care. Pt has h/o NIDDM with PAD and painful thick toenails that are difficult to trim. Pain interferes with ambulation. Aggravating factors include wearing enclosed shoe gear. Pain is relieved with periodic professional debridement.  Chief Complaint  Patient presents with   Nail Problem    DFC,Referring Provider Lauro Regulus, MD,lov:08/24,A1C:8.0,BS:unknown   New problem(s): None.   PCP is Lauro Regulus, MD.  Allergies  Allergen Reactions   Niacin Itching and Other (See Comments)   Penicillin G Rash   Penicillins Rash    Rash & joint pain.  And joint pain And joint pain     Review of Systems: Negative except as noted in the HPI.  Objective: No changes noted in today's physical examination. There were no vitals filed for this visit. Rickey Sep Igo [name] is a pleasant 89 y.o. male WD, WN in NAD. AAO x 3.  Vascular Examination: CFT <3 seconds b/l. DP/PT pulses faintly palpable b/l. Skin temperature gradient warm to warm b/l. No pain with calf compression. No ischemia or gangrene. No cyanosis or clubbing noted b/l. No edema noted b/l LE.   Neurological Examination: Sensation grossly intact b/l with 10 gram monofilament. Vibratory sensation intact b/l.   Dermatological Examination: Pedal skin warm and supple b/l.   No open wounds. No interdigital macerations.   There is noted onchyolysis of entire nailplate of L 2nd toe.  The nailbed remains intact. There is no erythema, no edema, no drainage, no underlying fluctuance.  Toenails 1-5 b/l thick, discolored, elongated with subungual debris and pain on dorsal palpation.    No hyperkeratotic nor porokeratotic lesions present on today's visit.  Musculoskeletal Examination: Muscle strength 5/5 to all lower extremity muscle groups bilaterally. No pain,  crepitus or joint limitation noted with ROM bilateral LE. No gross bony deformities bilaterally. Patient ambulates independent of any assistive aids.  Radiographs: None  Assessment/Plan: 1. Pain due to onychomycosis of toenails of both feet   2. Type 2 diabetes mellitus with vascular disease (HCC)     -Consent given for treatment as described below: -Examined patient. -Continue foot and shoe inspections daily. Monitor blood glucose per PCP/Endocrinologist's recommendations. -Patient to continue soft, supportive shoe gear daily. -Mycotic toenails 1-5 bilaterally were debrided in length and girth with sterile nail nippers and dremel without incident. -Loose nailplate L 2nd toe gently debrided en toto. Digital nailbed cleansed with alcohol. Triple antibiotic ointment applied to nailbed followed by light dressing. No further treatment required by patient. -Patient/POA to call should there be question/concern in the interim.   Return in about 3 months (around 02/28/2023).  Freddie Breech, DPM

## 2023-01-08 ENCOUNTER — Encounter: Payer: Self-pay | Admitting: Urology

## 2023-01-08 ENCOUNTER — Ambulatory Visit: Payer: Medicare Other | Admitting: Urology

## 2023-01-08 VITALS — BP 122/76 | HR 101 | Ht 72.0 in | Wt 190.0 lb

## 2023-01-08 DIAGNOSIS — N3941 Urge incontinence: Secondary | ICD-10-CM | POA: Diagnosis not present

## 2023-01-08 DIAGNOSIS — N401 Enlarged prostate with lower urinary tract symptoms: Secondary | ICD-10-CM

## 2023-01-08 LAB — BLADDER SCAN AMB NON-IMAGING: Scan Result: 0

## 2023-01-08 MED ORDER — MIRABEGRON ER 50 MG PO TB24: 50.0000 mg | ORAL_TABLET | Freq: Every day | ORAL | 3 refills | Status: DC

## 2023-01-08 MED ORDER — FINASTERIDE 5 MG PO TABS: 5.0000 mg | ORAL_TABLET | Freq: Every day | ORAL | 3 refills | Status: DC

## 2023-01-08 NOTE — Progress Notes (Signed)
I, Maysun Anabel Bene, acting as a scribe for Rickey Altes, MD., have documented all relevant documentation on the behalf of Rickey Altes, MD, as directed by  Rickey Altes, MD while in the presence of Rickey Altes, MD.  01/08/2023 1:59 PM   Domingo Sep Cassaro [name] 1929-09-10 [id number]  Referring provider: Lauro Regulus, MD [address] [facility] [address] [place],  [place] [address]  Chief Complaint  Patient presents with   Benign Prostatic Hypertrophy   Urologic history: 1.  BPH with lower urinary tract symptoms Finasteride daily Myrbetriq added 2020 for urgency/urge incontinence  2.  History elevated PSA Benign biopsy >10 years ago for PSA in upper 4 range  HPI: Rickey Gochanour Equihua [name] is a 89 y.o. male presents for annual follow-up.  Doing well since last visit Stable LUTS on finasteride and Myrbetriq Denies dysuria, gross hematuria Denies flank, abdominal or pelvic pain   PMH: Past Medical History:  Diagnosis Date   Allergic rhinitis    Colon polyps    Essential hypertension, benign    GERD (gastroesophageal reflux disease)    Other and unspecified hyperlipidemia    Pure hypercholesterolemia    Type II or unspecified type diabetes mellitus without mention of complication, not stated as uncontrolled    Unspecified essential hypertension    Unspecified hypothyroidism     Surgical History: Past Surgical History:  Procedure Laterality Date   FACIAL RECONSTRUCTION SURGERY     muscle surgery    TONSILLECTOMY      Home Medications:  Allergies as of 01/08/2023       Reactions   Niacin Itching, Other (See Comments)   Penicillin G Rash   Penicillins Rash   Rash & joint pain.  And joint pain And joint pain        Medication List        Accurate as of January 08, 2023  1:59 PM. If you have any questions, ask your nurse or doctor.          alum & mag hydroxide-simeth 200-200-20 MG/5ML suspension Commonly known as:  MAALOX/MYLANTA Take by mouth.   amLODipine 5 MG tablet Commonly known as: NORVASC Take 5 mg by mouth daily.   aspirin EC 81 MG tablet Take by mouth.   atorvastatin 20 MG tablet Commonly known as: LIPITOR TAKE 1 TABLET BY MOUTH ONCE DAILY   carvedilol 12.5 MG tablet Commonly known as: COREG   finasteride 5 MG tablet Commonly known as: PROSCAR Take 1 tablet (5 mg total) by mouth daily.   furosemide 20 MG tablet Commonly known as: LASIX Take 20 mg by mouth daily as needed.   hydrochlorothiazide 25 MG tablet Commonly known as: HYDRODIURIL Take 1 tablet by mouth every morning.   levothyroxine 125 MCG tablet Commonly known as: SYNTHROID Take 125 mcg by mouth every morning.   lisinopril-hydrochlorothiazide 20-12.5 MG tablet Commonly known as: ZESTORETIC Take by mouth.   LYCOPENE PO Take by mouth.   metFORMIN 500 MG 24 hr tablet Commonly known as: GLUCOPHAGE-XR Take 2 tablets by mouth daily.   mirabegron ER 50 MG Tb24 tablet Commonly known as: Myrbetriq Take 1 tablet (50 mg total) by mouth daily.   multivitamin tablet Take 1 tablet by mouth daily.   OneTouch Delica Lancets 33G Misc USE ONE LANCET TO CHECK GLUCOSE TWICE DAILY AS DIRECTED   OneTouch Ultra test strip Generic drug: glucose blood Use 2 (two) times daily.   potassium chloride SA  20 MEQ tablet Commonly known as: KLOR-CON M TAKE 1 TABLET BY MOUTH DAILY        Allergies:  Allergies  Allergen Reactions   Niacin Itching and Other (See Comments)   Penicillin G Rash   Penicillins Rash    Rash & joint pain.  And joint pain And joint pain     Family History: Family History  Problem Relation Age of Onset   Heart attack Mother    Heart disease Mother    Hypertension Mother    Hyperlipidemia Mother     Social History:  reports that he has never smoked. He has never used smokeless tobacco. He reports that he does not drink alcohol and does not use drugs.   Physical Exam: BP 122/76    Pulse (!) 101   Ht 6' (1.829 m)   Wt 190 lb (86.2 kg)   BMI 25.77 kg/m   Constitutional:  Alert and oriented, No acute distress. HEENT: [place] AT Respiratory: Normal respiratory effort, no increased work of breathing. Psychiatric: Normal mood and affect.   Assessment & Plan:    1.  BPH with LUTS Stable Finasteride refilled Continue annual follow-up   2.  Urge incontinence Stable on Myrbetriq which was refilled Bladder scan PVR 0 mL   I have reviewed the above documentation for accuracy and completeness, and I agree with the above.   Rickey Altes, MD  [facility] [address], [address] [place], [place] [address] [phone]

## 2023-01-30 IMAGING — CR DG CHEST 2V
1 series · 2 of 2 positions shown · non-contrast
Comparison: 07/13/2013

CLINICAL DATA: Back pain radiating to left shoulder

EXAM:
CHEST - 2 VIEW

[Series 1: dg chest 2 view · 0.14mm/px · 2 of 2 slices shown]
[im 1/2]
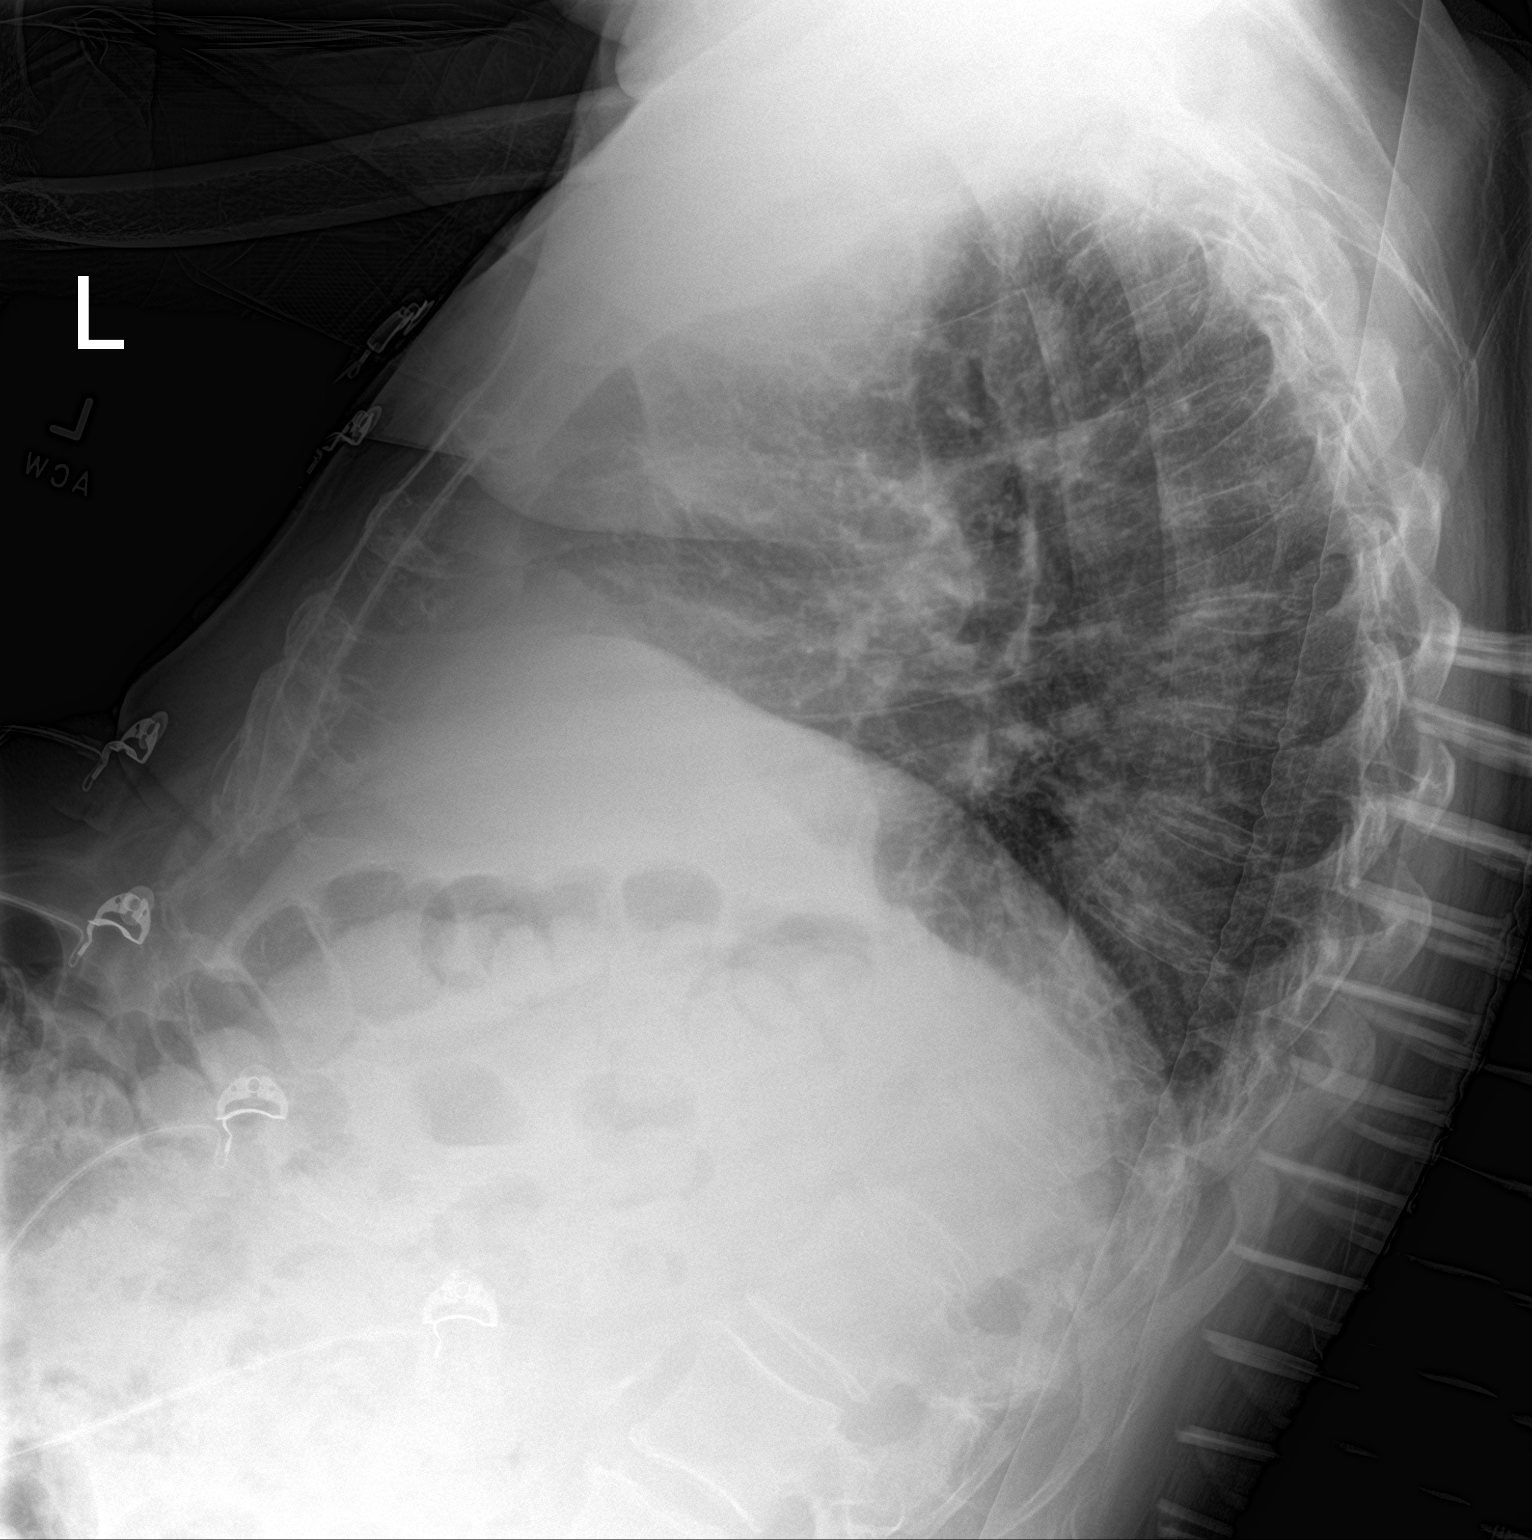
[im 2/2]
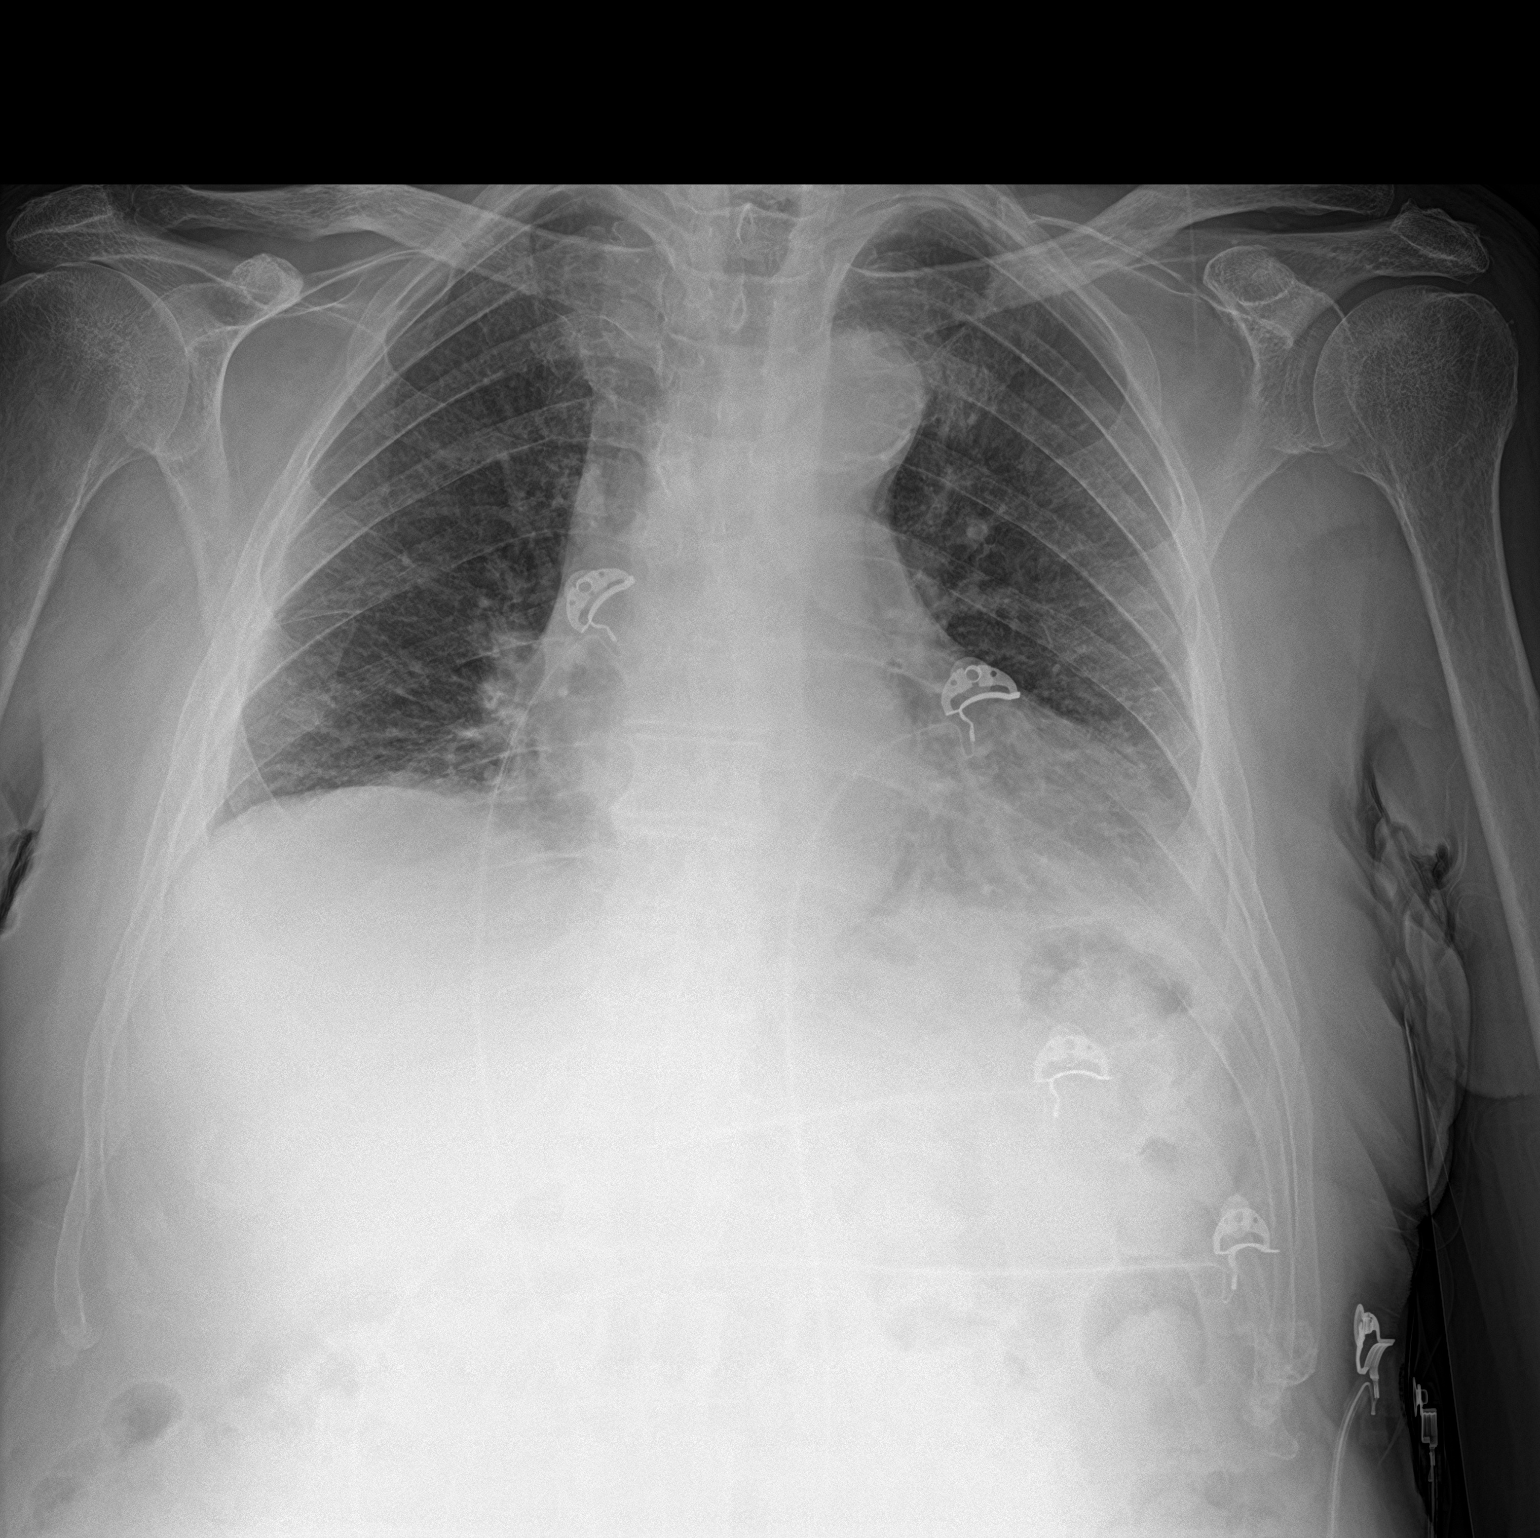

[2 of 2 positions shown; findings below may reference images not displayed]

FINDINGS: Frontal and lateral views of the chest demonstrate an unremarkable
cardiac silhouette. Stable ectasia of the thoracic aorta. No acute
airspace disease, effusion, or pneumothorax. No acute bony
abnormality.
IMPRESSION: 1. No acute intrathoracic process.

## 2023-03-10 ENCOUNTER — Ambulatory Visit (INDEPENDENT_AMBULATORY_CARE_PROVIDER_SITE_OTHER): Payer: Medicare Other | Admitting: Podiatry

## 2023-03-10 ENCOUNTER — Encounter: Payer: Self-pay | Admitting: Podiatry

## 2023-03-10 VITALS — Ht 72.0 in | Wt 190.0 lb

## 2023-03-10 DIAGNOSIS — B351 Tinea unguium: Secondary | ICD-10-CM

## 2023-03-10 DIAGNOSIS — M79674 Pain in right toe(s): Secondary | ICD-10-CM | POA: Diagnosis not present

## 2023-03-10 DIAGNOSIS — M79675 Pain in left toe(s): Secondary | ICD-10-CM

## 2023-03-10 DIAGNOSIS — E1159 Type 2 diabetes mellitus with other circulatory complications: Secondary | ICD-10-CM

## 2023-03-14 NOTE — Progress Notes (Signed)
  Subjective:  Patient ID: Rickey Martes [name], male    DOB: 1929/12/28,  MRN: [id number]  89 y.o. male presents to clinic today with at risk foot care. Pt has h/o NIDDM with PAD and painful elongated mycotic toenails 1-5 bilaterally which are tender when wearing enclosed shoe gear. Pain is relieved with periodic professional debridement. Chief Complaint  Patient presents with   Nail Problem    Pt is here for [facility], last A1C was 7.8 PCP is Dr Dareen Piano and LOV was in August.   PCP is Lauro Regulus, MD.  Allergies  Allergen Reactions   Niacin Itching and Other (See Comments)   Penicillin G Rash   Penicillins Rash    Rash & joint pain.  And joint pain And joint pain     Review of Systems: Negative except as noted in the HPI.   Objective:  Rickey Sep Cocke [name] is a pleasant 89 y.o. male WD, WN in NAD.  AAO x 3.  Vascular Examination: CFT <3 seconds b/l LE. Faintly palpable pedal pulses b/l. No pain with calf compression b/l. Lower extremity skin temperature gradient within normal limits. No edema noted b/l LE. No cyanosis or clubbing noted b/l LE.  Neurological Examination: Patient unable to complete sensory examination due to cognition but does respond to external noxious stimuli.   Sensation grossly intact b/l with 10 gram monofilament. Vibratory sensation intact b/l.   Dermatological Examination: Pedal skin with normal turgor, texture and tone b/l. Toenails 1-5 b/l thick, discolored, elongated with subungual debris and pain on dorsal palpation. No hyperkeratotic lesions noted b/l.   Musculoskeletal Examination: Muscle strength 5/5 to b/l LE. No pain, crepitus or joint limitation noted with ROM bilateral LE. No gross bony deformities bilaterally.  Radiographs: None  Last A1c:       No data to display           Assessment:   1. Pain due to onychomycosis of toenails of both feet   2. Type 2 diabetes mellitus with vascular disease (HCC)    Plan:  -Patient  was evaluated today. All questions/concerns addressed on today's visit. -No new findings. No new orders. -Continue foot and shoe inspections daily. Monitor blood glucose per PCP/Endocrinologist's recommendations. -Continue supportive shoe gear daily. -Toenails 1-5 b/l were debrided in length and girth with sterile nail nippers and dremel without iatrogenic bleeding.  -Patient/POA to call should there be question/concern in the interim.  Return in about 3 months (around 06/08/2023).  Freddie Breech, DPM      [place] LOCATION: [address], [place] [address]                   Office [phone]   [facility] LOCATION: [address] [place], [place] [address] Office [phone]

## 2023-06-09 ENCOUNTER — Ambulatory Visit (INDEPENDENT_AMBULATORY_CARE_PROVIDER_SITE_OTHER): Payer: Medicare Other | Admitting: Podiatry

## 2023-06-09 ENCOUNTER — Encounter: Payer: Self-pay | Admitting: Podiatry

## 2023-06-09 DIAGNOSIS — M79674 Pain in right toe(s): Secondary | ICD-10-CM

## 2023-06-09 DIAGNOSIS — B351 Tinea unguium: Secondary | ICD-10-CM | POA: Diagnosis not present

## 2023-06-09 DIAGNOSIS — M79675 Pain in left toe(s): Secondary | ICD-10-CM

## 2023-06-09 DIAGNOSIS — E1159 Type 2 diabetes mellitus with other circulatory complications: Secondary | ICD-10-CM

## 2023-06-09 NOTE — Progress Notes (Signed)
  Subjective:  Patient ID: Rickey Martes [name], male    DOB: Jul 29, 1929,  MRN: [id number]  89 y.o. male presents with at risk foot care. Pt has h/o NIDDM with PAD and painful, elongated thickened toenails x 10 which are symptomatic when wearing enclosed shoe gear. This interferes with his/her daily activities.  Patient states his feet/legs swell and he is on a diuretic.  PCP: Lauro Regulus, MD. LOV was 03/31/2023.  New problem(s): None.   Review of Systems: Negative except as noted in the HPI.   Allergies  Allergen Reactions   Niacin Itching and Other (See Comments)   Penicillin G Rash   Penicillins Rash    Rash & joint pain.  And joint pain And joint pain     Objective:  There were no vitals filed for this visit. Constitutional Patient is a pleasant 89 y.o. male WD, WN in NAD. AAO x 3.  Vascular Capillary fill time to digits <3 seconds.  DP/PT pulse(s) are faintly palpable b/l lower extremities. +1 Edema b/l. Pedal hair absent b/l. Lower extremity skin temperature gradient warm to cool b/l. No pain with calf compression b/l. No cyanosis or clubbing noted. No ischemia nor gangrene noted b/l.   Neurologic Protective sensation intact 5/5 intact bilaterally with 10g monofilament b/l. Vibratory sensation intact b/l. No clonus b/l.   Dermatologic Pedal skin is thin, shiny and atrophic b/l.  No open wounds b/l lower extremities. No interdigital macerations b/l lower extremities. Toenails 1-5 b/l elongated, discolored, dystrophic, thickened, crumbly with subungual debris and tenderness to dorsal palpation. No corns, calluses nor porokeratotic lesions noted.  Orthopedic: Normal muscle strength 5/5 to all lower extremity muscle groups bilaterally. No pain, crepitus or joint limitation noted with ROM bilateral LE. No gross bony deformities bilaterally.   Assessment:   1. Pain due to onychomycosis of toenails of both feet   2. Type 2 diabetes mellitus with vascular disease (HCC)     Plan:  -Consent given for treatment as described below: -Examined patient. -Patient to continue soft, supportive shoe gear daily. -Mycotic toenails 1-5 bilaterally were debrided in length and girth with sterile nail nippers and dremel without incident. -Patient/POA to call should there be question/concern in the interim.  Return in about 3 months (around 09/09/2023).  Freddie Breech, DPM      [place] LOCATION: [address], [place] [address]                   Office [phone]   [facility] LOCATION: [address] [place], [place] [address] Office [phone]

## 2023-09-11 ENCOUNTER — Encounter: Payer: Self-pay | Admitting: Podiatry

## 2023-09-11 ENCOUNTER — Ambulatory Visit: Admitting: Podiatry

## 2023-09-11 DIAGNOSIS — M79674 Pain in right toe(s): Secondary | ICD-10-CM

## 2023-09-11 DIAGNOSIS — E119 Type 2 diabetes mellitus without complications: Secondary | ICD-10-CM

## 2023-09-11 DIAGNOSIS — M79675 Pain in left toe(s): Secondary | ICD-10-CM | POA: Diagnosis not present

## 2023-09-11 DIAGNOSIS — E1159 Type 2 diabetes mellitus with other circulatory complications: Secondary | ICD-10-CM

## 2023-09-11 DIAGNOSIS — B351 Tinea unguium: Secondary | ICD-10-CM | POA: Diagnosis not present

## 2023-09-11 DIAGNOSIS — R600 Localized edema: Secondary | ICD-10-CM

## 2023-09-11 NOTE — Progress Notes (Signed)
 ANNUAL DIABETIC FOOT EXAM  Subjective: Rickey Tamburrino Delpozo [name] presents today for annual diabetic foot exam.  Patient confirms h/o diabetes. States his last A1c was about 8.0%.  Patient denies any h/o foot wounds.  He does have chronic edema of bilateral lower extremities. They go down at night. He does not wear compression hose.  Rickey Moulding, MD is patient's PCP. LOV 07/29/2023.  Past Medical History:  Diagnosis Date   Allergic rhinitis    Colon polyps    Essential hypertension, benign    GERD (gastroesophageal reflux disease)    Other and unspecified hyperlipidemia    Pure hypercholesterolemia    Type II or unspecified type diabetes mellitus without mention of complication, not stated as uncontrolled    Unspecified essential hypertension    Unspecified hypothyroidism    Patient Active Problem List   Diagnosis Date Noted   Type 2 diabetes mellitus with vascular disease (HCC) 04/29/2019   History of elevated PSA 12/13/2017   Benign prostatic hyperplasia with lower urinary tract symptoms 12/13/2017   Health care maintenance 03/15/2015   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) 08/29/2013   Hyperlipidemia associated with type 2 diabetes mellitus (HCC) 08/29/2013   Hypertensive left ventricular hypertrophy, without heart failure 08/29/2013   Flatulence/gas pain/belching 07/22/2013   HTN (hypertension) 07/22/2013   Shoulder pain 07/22/2013   Hyperlipidemia 07/22/2013   Hypothyroid 07/22/2013   GERD (gastroesophageal reflux disease) 07/22/2013   ED (erectile dysfunction) of organic origin 05/13/2012   Family history of malignant neoplasm of prostate 05/13/2012   Sciatica 05/13/2012   Past Surgical History:  Procedure Laterality Date   FACIAL RECONSTRUCTION SURGERY     muscle surgery    TONSILLECTOMY     Current Outpatient Medications on File Prior to Visit  Medication Sig Dispense Refill   alum & mag hydroxide-simeth (MAALOX/MYLANTA)  200-200-20 MG/5ML suspension Take by mouth.     amLODipine (NORVASC) 5 MG tablet Take 5 mg by mouth daily.     aspirin EC 81 MG tablet Take by mouth.     atorvastatin (LIPITOR) 20 MG tablet TAKE 1 TABLET BY MOUTH ONCE DAILY     carvedilol (COREG) 12.5 MG tablet   3   finasteride  (PROSCAR ) 5 MG tablet Take 1 tablet (5 mg total) by mouth daily. 90 tablet 3   furosemide (LASIX) 20 MG tablet Take 20 mg by mouth daily as needed.     glucose blood (ONETOUCH ULTRA) test strip Use 2 (two) times daily.     hydrochlorothiazide (HYDRODIURIL) 25 MG tablet Take 1 tablet by mouth every morning.     levothyroxine (SYNTHROID) 125 MCG tablet Take 125 mcg by mouth every morning.     lisinopril-hydrochlorothiazide (ZESTORETIC) 20-12.5 MG tablet Take by mouth.     LYCOPENE PO Take by mouth.     metFORMIN (GLUCOPHAGE-XR) 500 MG 24 hr tablet Take 2 tablets by mouth daily.     mirabegron  ER (MYRBETRIQ ) 50 MG TB24 tablet Take 1 tablet (50 mg total) by mouth daily. 90 tablet 3   Multiple Vitamin (MULTIVITAMIN) tablet Take 1 tablet by mouth daily.     OneTouch Delica Lancets 33G MISC USE ONE LANCET TO CHECK GLUCOSE TWICE DAILY AS DIRECTED     potassium chloride SA (KLOR-CON) 20 MEQ tablet TAKE 1 TABLET BY MOUTH DAILY     No current facility-administered medications on file prior to visit.    Allergies  Allergen Reactions   Niacin Itching and Other (See Comments)  Penicillin G Rash   Penicillins Rash    Rash & joint pain.  And joint pain And joint pain    Social History   Occupational History   Not on file  Tobacco Use   Smoking status: Never   Smokeless tobacco: Never  Substance and Sexual Activity   Alcohol use: No   Drug use: No   Sexual activity: Yes    Birth control/protection: None   Family History  Problem Relation Age of Onset   Heart attack Mother    Heart disease Mother    Hypertension Mother    Hyperlipidemia Mother    Immunization History  Administered Date(s) Administered    Influenza Split 01/06/2015   Influenza, High Dose Seasonal PF 12/30/2016   Influenza,inj,Quad PF,6+ Mos 12/20/2019   Influenza-Unspecified 12/31/2013, 12/22/2015, 12/20/2016, 01/02/2018, 12/18/2018   PFIZER Comirnaty(Gray Top)Covid-19 Tri-Sucrose Vaccine 04/12/2019, 12/31/2019   PFIZER(Purple Top)SARS-COV-2 Vaccination 04/12/2019, 05/03/2019, 12/31/2019   Zoster Recombinant(Shingrix) 03/27/2018, 06/12/2018     Review of Systems: Negative except as noted in the HPI.   Objective: There were no vitals filed for this visit.  Rickey Kirsh Bellew [name] is a pleasant 89 y.o. male in NAD. AAO X 3.  Diabetic foot exam was performed with the following findings:   No deformities, ulcerations, or other skin breakdown Normal sensation of 10g monofilament Vascular Examination: CFT <3 seconds b/l. DP/PT pulses faintly palpable b/l. Skin temperature gradient warm to warm b/l. No pain with calf compression. No ischemia or gangrene. No cyanosis or clubbing noted b/l. Pedal hair absent. +1 pitting edema noted BLE. No varicosities noted.   Neurological Examination:Vibratory sensation intact b/l. Vibratory sensation diminished b/l.  Dermatological Examination: Pedal skin warm and supple b/l.   No open wounds. No interdigital macerations.  Toenails 1-5 b/l thick, discolored, elongated with subungual debris and pain on dorsal palpation.    No corns, calluses nor porokeratotic lesions noted.  Musculoskeletal Examination: Muscle strength 5/5 to all lower extremity muscle groups bilaterally. No pain, crepitus or joint limitation noted with ROM bilateral LE. No gross bony deformities bilaterally. Ambulates independently without assistive devices.  Radiographs: None     ADA FOOT RISK CATEGORIZATION Low Risk :  Patient has all of the following: Intact protective sensation No prior foot ulcer  No severe deformity Pedal pulses present  Assessment: 1. Pain due to onychomycosis of toenails of both feet    2. Bilateral lower extremity edema   3. Type 2 diabetes mellitus with vascular disease (HCC)   4. Encounter for diabetic foot exam (HCC)     Plan: Diabetic foot examination performed today. All patient's and/or POA's questions/concerns addressed on today's visit. Toenails 1-5 debrided in length and girth without incident. Continue foot and shoe inspections daily. Monitor blood glucose per PCP/Endocrinologist's recommendations. Continue soft, supportive shoe gear daily. Report any pedal injuries to medical professional. Call office if there are any questions/concerns. -Patient/POA to call should there be question/concern in the interim. Return in about 3 months (around 12/12/2023).  Luella Sager, DPM      [place] LOCATION: [address] [place] [address]                   Office [phone])  [phone]   [facility] LOCATION: [address] [place], [place] [address] Office [phone]

## 2023-12-18 ENCOUNTER — Ambulatory Visit (INDEPENDENT_AMBULATORY_CARE_PROVIDER_SITE_OTHER)

## 2023-12-18 ENCOUNTER — Ambulatory Visit (INDEPENDENT_AMBULATORY_CARE_PROVIDER_SITE_OTHER): Admitting: Podiatry

## 2023-12-18 ENCOUNTER — Encounter: Payer: Self-pay | Admitting: Podiatry

## 2023-12-18 DIAGNOSIS — L02612 Cutaneous abscess of left foot: Secondary | ICD-10-CM

## 2023-12-18 DIAGNOSIS — M79674 Pain in right toe(s): Secondary | ICD-10-CM

## 2023-12-18 DIAGNOSIS — E1159 Type 2 diabetes mellitus with other circulatory complications: Secondary | ICD-10-CM | POA: Diagnosis not present

## 2023-12-18 DIAGNOSIS — L03032 Cellulitis of left toe: Secondary | ICD-10-CM

## 2023-12-18 DIAGNOSIS — M79675 Pain in left toe(s): Secondary | ICD-10-CM

## 2023-12-18 DIAGNOSIS — B351 Tinea unguium: Secondary | ICD-10-CM

## 2023-12-18 MED ORDER — GENTAMICIN SULFATE 0.1 % EX CREA: TOPICAL_CREAM | CUTANEOUS | 1 refills | Status: AC

## 2023-12-18 MED ORDER — CEPHALEXIN 500 MG PO CAPS: 500.0000 mg | ORAL_CAPSULE | Freq: Three times a day (TID) | ORAL | 0 refills | Status: AC

## 2023-12-18 NOTE — Patient Instructions (Signed)
 EPSOM SALT FOOT SOAK INSTRUCTIONS  *IF YOU HAVE BEEN PRESCRIBED ANTIBIOTICS, TAKE AS INSTRUCTED UNTIL ALL ARE GONE*  Shopping List:  A. Plain epsom salt (not scented) B. Cephalexin  capsules C. 1-inch fabric band-aids   Place 1/4 cup of epsom salts in 2 quarts of warm tap water. IF YOU ARE DIABETIC, OR HAVE NEUROPATHY, CHECK THE TEMPERATURE OF THE WATER WITH YOUR ELBOW.   Submerge your foot/feet in the solution and soak for 10-15 minutes.      3.  Next, remove your foot/feet from solution, blot dry the affected area.    4.  Apply light amount of antibiotic cream/ointment and cover with fabric band-aid .  5.  This soak should be done once a day for 7-10 days.   6.  Monitor for any signs/symptoms of infection such as redness, swelling, odor, drainage, increased pain, or non-healing of digit.   7.  Please do not hesitate to call the office and speak to a Nurse or Doctor if you have questions.   8.  If you experience fever, chills, nightsweats, nausea or vomiting with worsening of digit/foot, please go to the emergency room.

## 2023-12-18 NOTE — Progress Notes (Signed)
 Subjective:  Patient ID: Rickey Sherwood Cones [name], male    DOB: Nov 16, 1929,  MRN: [id number]  89 y.o. male presents to clinic with  at risk foot care. Pt has h/o NIDDM with PAD and painful elongated mycotic toenails 1-5 bilaterally which are tender when wearing enclosed shoe gear. Pain is relieved with periodic professional debridement.  Chief Complaint  Patient presents with   [facility]    Rm2 Diabetic foot care/ Dr. Layman Piety last visit April 29,2025/ A1c 8.1     New problem(s): Patient has c/o left great toe pain. He relates he had a fall in August which resulted in toenail being jammed into the one of the corners. He developed toe pain and redness and began to soak it. Patient saw his PCP, Dr. Particia Brave at the [facility] and was prescribed cephalexin  500 mg tid x 7 days and was told to continue soaking and follow up with our office.   Patient's son states he came up to the office to schedule an appointment and was told he would have to wait until his regularly scheduled appointment for nail care. Son states he informed staff this was more urgent than a routine appointment and his dad was diabetic. Son states he was told I was on vacation and no other doctor was available until 12/11/2023.  PCP is Piety Layman ORN, MD.  Allergies  Allergen Reactions   Niacin Itching and Other (See Comments)   Penicillin G Rash   Penicillins Rash    Rash & joint pain.  And joint pain And joint pain     Review of Systems: Negative except as noted in the HPI.   Objective:  Rickey Sherwood Veltre [name] is a pleasant 89 y.o. male WD, WN in NAD. AAO x 3.  Vascular Examination: CFT <3 seconds b/l. DP/PT pulses faintly palpable b/l. Skin temperature gradient warm to warm b/l. No pain with calf compression. No ischemia or gangrene. No cyanosis or clubbing noted b/l. Pedal hair absent. Trace edema noted BLE. No varicosities noted.   Neurological  Examination:Vibratory sensation intact b/l. Vibratory sensation diminished b/l.  Dermatological Examination: Left hallux with broken nail plate into lateral border. Mild erythema localized to border with granulation tissue. Erythema is distal to IPJ. No purulence noted. No odor. +Tender to palpation.  Pedal skin warm and supple b/l.   No open wounds. No interdigital macerations.  Toenails 2-5 b/l and right great toe thick, discolored, elongated with subungual debris and pain on dorsal palpation.    No corns, calluses nor porokeratotic lesions noted.  Musculoskeletal Examination: Muscle strength 5/5 to all lower extremity muscle groups bilaterally. No pain, crepitus or joint limitation noted with ROM bilateral LE. No gross bony deformities bilaterally. Ambulates independently without assistive devices.  Xray findings left foot: No gas in tissues left foot. No bone erosion noted distal phalanx left great toe. No foreign body evident left foot.  Assessment:   1. Pain due to onychomycosis of toenails of both feet   2. Cellulitis and abscess of toe of left foot   3. Type 2 diabetes mellitus with vascular disease (HCC)    Plan:  Patient was evaluated and treated. All patient's and/or POA's questions/concerns addressed on today's visit. Discussed events that occurred in attempting to get [name], [name], scheduled with his son. Patient should have been scheduled as urgent. Provided patient's son Practice Administrator's information to discuss chain of events.  Offered patient option of seeing Dr. Franky Blanch today who  could perform a partial nail avulsion. Patient declined on today and wanted to see what I could do for him today.  No invasive procedure(s) performed. Offending nail border debrided and curretaged lateral border left hallux utilizing sterile nail nipper and currette. Border(s) cleansed with alcohol and triple antibiotic ointment applied. Dispensed written instructions for once  daily epsom salt soaks for 7-10 days. Patient noted relief of pain with treatment providedd today. Call office if there are any concerns. Prescription written for Gentamicin  Cream. Patient is to apply to left great toe once daily and cover with dressing.  Rx for Keflex  500 mg po, #21, to be taken one capsule tid for 7 days. Patient/POA to call should there be question/concern in the interim. Patient will follow up with Dr. Verta for cellulitis/abscess left great toe in 7-10 days.  Mycotic toenails right great toe and 2-5 b/l debrided in length and girth without incident.  Continue daily foot inspections and monitor blood glucose per PCP/Endocrinologist's recommendations.Continue soft, supportive shoe gear daily. Report any pedal injuries to medical professional. Call office if there are any quesitons/concerns.  Return in about 9 weeks (around 02/19/2024).  Delon LITTIE Merlin, DPM      [place] LOCATION: [address], [place] [address]                   Office [phone]   [facility] LOCATION: [address] [place], [place] [address] Office [phone]

## 2024-01-07 ENCOUNTER — Ambulatory Visit (INDEPENDENT_AMBULATORY_CARE_PROVIDER_SITE_OTHER): Admitting: Urology

## 2024-01-07 ENCOUNTER — Encounter: Payer: Self-pay | Admitting: Urology

## 2024-01-07 ENCOUNTER — Ambulatory Visit: Admitting: Podiatry

## 2024-01-07 ENCOUNTER — Telehealth: Payer: Self-pay | Admitting: *Deleted

## 2024-01-07 VITALS — BP 135/84 | HR 72 | Ht 72.0 in | Wt 192.0 lb

## 2024-01-07 DIAGNOSIS — E1159 Type 2 diabetes mellitus with other circulatory complications: Secondary | ICD-10-CM

## 2024-01-07 DIAGNOSIS — L03032 Cellulitis of left toe: Secondary | ICD-10-CM

## 2024-01-07 DIAGNOSIS — N401 Enlarged prostate with lower urinary tract symptoms: Secondary | ICD-10-CM | POA: Diagnosis not present

## 2024-01-07 DIAGNOSIS — L02612 Cutaneous abscess of left foot: Secondary | ICD-10-CM | POA: Diagnosis not present

## 2024-01-07 LAB — BLADDER SCAN AMB NON-IMAGING: Scan Result: 60

## 2024-01-07 MED ORDER — CLINDAMYCIN HCL 150 MG PO CAPS: 150.0000 mg | ORAL_CAPSULE | Freq: Three times a day (TID) | ORAL | 0 refills | Status: AC

## 2024-01-07 MED ORDER — FINASTERIDE 5 MG PO TABS: 5.0000 mg | ORAL_TABLET | Freq: Every day | ORAL | 3 refills | Status: AC

## 2024-01-07 MED ORDER — GEMTESA 75 MG PO TABS: 75.0000 mg | ORAL_TABLET | Freq: Every day | ORAL | Status: DC

## 2024-01-07 NOTE — Progress Notes (Signed)
 01/07/2024 10:32 AM   Rickey Sherwood Cones [name] [birth year] [id number]  Referring provider: Lenon Layman ORN, MD [address] [facility] [address] [place],  [place] [address]  Chief Complaint  Patient presents with   Benign Prostatic Hypertrophy   Urologic history:  1.  BPH with lower urinary tract symptoms Finasteride  daily Myrbetriq  added 2020 for urgency/urge incontinence   2.  History elevated PSA Benign biopsy >10 years ago for PSA in upper 4 range  HPI: Rickey Menna Bookwalter [name] is a 89 y.o. male presents for annual follow-up.  Doing well since last visit Does not feel Myrbetriq  is as effective on his storage late voiding symptoms.  Only gets up once per night however during the daytime he has noted worsening urgency with occasional urge incontinence primarily when changing positions from supine/sitting to standing Denies dysuria, gross hematuria Denies flank, abdominal or pelvic pain  PMH: Past Medical History:  Diagnosis Date   Allergic rhinitis    Colon polyps    Essential hypertension, benign    GERD (gastroesophageal reflux disease)    Other and unspecified hyperlipidemia    Pure hypercholesterolemia    Type II or unspecified type diabetes mellitus without mention of complication, not stated as uncontrolled    Unspecified essential hypertension    Unspecified hypothyroidism     Surgical History: Past Surgical History:  Procedure Laterality Date   FACIAL RECONSTRUCTION SURGERY     muscle surgery    TONSILLECTOMY      Home Medications:  Allergies as of 01/07/2024       Reactions   Niacin Itching, Other (See Comments)   Penicillin G Rash   Penicillins Rash   Rash & joint pain.  And joint pain And joint pain        Medication List        Accurate as of January 07, 2024 10:32 AM. If you have any questions, ask your nurse or doctor.          alum & mag hydroxide-simeth 200-200-20 MG/5ML suspension Commonly known as:  MAALOX/MYLANTA Take by mouth.   amLODipine 5 MG tablet Commonly known as: NORVASC Take 5 mg by mouth daily.   aspirin EC 81 MG tablet Take by mouth.   atorvastatin 20 MG tablet Commonly known as: LIPITOR TAKE 1 TABLET BY MOUTH ONCE DAILY   carvedilol 12.5 MG tablet Commonly known as: COREG   finasteride  5 MG tablet Commonly known as: PROSCAR  Take 1 tablet (5 mg total) by mouth daily.   furosemide 20 MG tablet Commonly known as: LASIX Take 20 mg by mouth daily as needed.   gentamicin  cream 0.1 % Commonly known as: GARAMYCIN  Apply to affected toe once daily.   hydrochlorothiazide 25 MG tablet Commonly known as: HYDRODIURIL Take 1 tablet by mouth every morning.   levothyroxine 125 MCG tablet Commonly known as: SYNTHROID Take 125 mcg by mouth every morning.   lisinopril-hydrochlorothiazide 20-12.5 MG tablet Commonly known as: ZESTORETIC Take by mouth.   LYCOPENE PO Take by mouth.   metFORMIN 500 MG 24 hr tablet Commonly known as: GLUCOPHAGE-XR Take 2 tablets by mouth daily.   mirabegron  ER 50 MG Tb24 tablet Commonly known as: Myrbetriq  Take 1 tablet (50 mg total) by mouth daily.   multivitamin tablet Take 1 tablet by mouth daily.   OneTouch Delica Lancets 33G Misc USE ONE LANCET TO CHECK GLUCOSE TWICE DAILY AS DIRECTED   OneTouch Ultra test strip Generic drug: glucose blood Use 2 (two) times  daily.   potassium chloride SA 20 MEQ tablet Commonly known as: KLOR-CON M TAKE 1 TABLET BY MOUTH DAILY        Allergies:  Allergies  Allergen Reactions   Niacin Itching and Other (See Comments)   Penicillin G Rash   Penicillins Rash    Rash & joint pain.  And joint pain And joint pain     Family History: Family History  Problem Relation Age of Onset   Heart attack Mother    Heart disease Mother    Hypertension Mother    Hyperlipidemia Mother     Social History:  reports that he has never smoked. He has never used smokeless tobacco. He reports  that he does not drink alcohol and does not use drugs.   Physical Exam: BP 135/84   Pulse 72   Ht 6' (1.829 m)   Wt 192 lb (87.1 kg)   BMI 26.04 kg/m   Constitutional:  Alert, No acute distress. HEENT: [place] AT Respiratory: Normal respiratory effort, no increased work of breathing. Psychiatric: Normal mood and affect.   Assessment & Plan:    1.  BPH with LUTS PVR today 60 mL Finasteride  refilled He was interested in a trial of Gemtesa and given samples x 28 days.  If he feels more effective then Myrbetriq  he will call back for an Rx otherwise he will resume Myrbetriq  1 year follow-up with PVR   Rickey JAYSON Barba, MD  [facility] [address] [place], [place] [address] [phone]

## 2024-01-07 NOTE — Telephone Encounter (Signed)
 Dr, Stoioff was going to give patient a month of gemtesa but we did not have any. Patient will call back and see if we got any in next week /

## 2024-01-07 NOTE — Progress Notes (Signed)
 He presents today on referral from Dr. May about an ingrown toenail fibular border of the hallux left.  He states that he cannot have anything surgical done 1 because he is diabetic and to because he has a wife with dementia at home and he is not able to take care of the toe itself.  Objective: Vital signs are stable he is alert and oriented x 3.  Pulses are palpable.  Edema to the left lower leg and foot.  Cellulitic process is limited only to the distal lateral tuft of the toe and some tenderness along the proximal nail fold.  There is no purulence no malodor.  Assessment cellulitis abscess toe fibular border hallux left.  Plan: Recommended that he try to soak Epsom salts and warm water and we started clindamycin twice daily.  Follow-up with him in 1 month sooner if needed.

## 2024-01-08 ENCOUNTER — Ambulatory Visit: Payer: Medicare Other | Admitting: Urology

## 2024-01-09 ENCOUNTER — Ambulatory Visit: Payer: Self-pay | Admitting: Urology

## 2024-02-02 ENCOUNTER — Ambulatory Visit (INDEPENDENT_AMBULATORY_CARE_PROVIDER_SITE_OTHER): Admitting: Podiatry

## 2024-02-02 DIAGNOSIS — E1159 Type 2 diabetes mellitus with other circulatory complications: Secondary | ICD-10-CM

## 2024-02-02 DIAGNOSIS — L02612 Cutaneous abscess of left foot: Secondary | ICD-10-CM

## 2024-02-02 DIAGNOSIS — L03032 Cellulitis of left toe: Secondary | ICD-10-CM

## 2024-02-02 NOTE — Progress Notes (Signed)
 He presents today for follow-up of his I&D hallux left.  He denies fever chills nausea fine muscle aches pains.  Objective: There is no erythema edema cellulitis drainage odor.  Assessment: Well-healing toe.  Plan: Follow-up with me on an as-needed basis watch for reoccurrence.

## 2024-02-11 ENCOUNTER — Other Ambulatory Visit: Payer: Self-pay | Admitting: Urology

## 2024-02-19 ENCOUNTER — Ambulatory Visit: Admitting: Podiatry

## 2024-02-19 DIAGNOSIS — B351 Tinea unguium: Secondary | ICD-10-CM

## 2024-02-19 DIAGNOSIS — L03031 Cellulitis of right toe: Secondary | ICD-10-CM | POA: Diagnosis not present

## 2024-02-19 DIAGNOSIS — E1159 Type 2 diabetes mellitus with other circulatory complications: Secondary | ICD-10-CM

## 2024-02-19 DIAGNOSIS — M79675 Pain in left toe(s): Secondary | ICD-10-CM

## 2024-02-19 DIAGNOSIS — M79674 Pain in right toe(s): Secondary | ICD-10-CM | POA: Diagnosis not present

## 2024-02-19 MED ORDER — DOXYCYCLINE HYCLATE 100 MG PO CAPS: 100.0000 mg | ORAL_CAPSULE | Freq: Two times a day (BID) | ORAL | 0 refills | Status: AC

## 2024-02-19 NOTE — Patient Instructions (Addendum)
 -I  Apply Gentamicin  Cream to right 2nd toe once daily.  See Dr. Verta for right 2nd toe after antibiotics are completed. Call office if you have any problems.  IF PRESCRIBED ORAL ANTIBIOTICS, TAKE ALL MEDICATION AS PRESCRIBED UNTIL ALL ARE GONE.   IF YOU EXPERIENCE ANY FEVER, CHILLS, NIGHTSWEATS, NAUSEA OR VOMITING, ELEVATED OR LOW BLOOD SUGARS, REPORT TO EMERGENCY ROOM.  IF YOU EXPERIENCE INCREASED REDNESS, PAIN, SWELLING, DISCOLORATION, ODOR, PUS, DRAINAGE OR WARMTH OF YOUR FOOT, REPORT TO EMERGENCY ROOM.  Cellulitis, Adult  Cellulitis is a skin infection. The infected area is often warm, red, swollen, and sore. It occurs most often on the legs, feet, and toes, but can happen on any part of the body. This condition can be life-threatening without treatment. It is very important to get treated right away. What are the causes? This condition is caused by bacteria. The bacteria enter through a break in the skin, such as: A cut. A burn. A bug bite. An animal bite. An open sore. A crack. What increases the risk? Having a weak body's defense system (immune system). Being older than 88 years old. Having a blood sugar problem (diabetes). Having a long-term liver disease (cirrhosis) or kidney disease. Being very overweight (obese). Having a skin problem, such as: An itchy rash. A rash caused by a fungus. A rash with blisters. Slow movement of blood in the veins (venous stasis). Fluid buildup below the skin (edema). This condition is more likely to occur in people who: Have open cuts, burns, bites, or scrapes on the skin. Have been treated with high-energy rays (radiation). Use IV drugs. What are the signs or symptoms? Skin that: Looks red or purple, or slightly darker than your usual skin color. Has streaks. Has spots. Is swollen. Is sore or painful when you touch it. Is warm. A fever. Chills. Blisters. Tiredness (fatigue). How is this treated? Medicines to treat  infections or allergies. Rest. Placing cold or warm cloths on the skin. Staying in the hospital, if the condition is very bad. You may need medicines through an IV. Follow these instructions at home: Medicines Take over-the-counter and prescription medicines only as told by your doctor. If you were prescribed antibiotics, take them as told by your doctor. Do not stop using them even if you start to feel better. General instructions Drink enough fluid to keep your pee (urine) pale yellow. Do not touch or rub the infected area. Raise (elevate) the infected area above the level of your heart while you are sitting or lying down. Return to your normal activities when your doctor says that it is safe. Place cold or warm cloths on the area as told by your doctor. Keep all follow-up visits. Your doctor will need to make sure that a more serious infection is not developing. Contact a doctor if: You have a fever. You do not start to get better after 1-2 days of treatment. Your bone or joint under the infected area starts to hurt after the skin has healed. Your infection comes back in the same area or another area. Signs of this may include: You have a swollen bump in the area. Your red area gets larger, turns dark in color, or hurts more. You have more fluid coming from the wound. Pus or a bad smell develops in your infected area. You have more pain. You feel sick and have muscle aches and weakness. You develop vomiting or watery poop that will not go away. Get help right away if: You  see red streaks coming from the area. You notice the skin turns purple or black and falls off. These symptoms may be an emergency. Get help right away. Call 911. Do not wait to see if the symptoms will go away. Do not drive yourself to the hospital. This information is not intended to replace advice given to you by your health care provider. Make sure you discuss any questions you have with your health care  provider. Document Revised: 11/13/2021 Document Reviewed: 11/13/2021 Elsevier Patient Education  2024 [employer].

## 2024-02-23 ENCOUNTER — Other Ambulatory Visit: Payer: Self-pay | Admitting: Urology

## 2024-02-28 ENCOUNTER — Encounter: Payer: Self-pay | Admitting: Podiatry

## 2024-02-28 NOTE — Progress Notes (Signed)
 Subjective:  Patient ID: Rickey Sherwood Cones [name], male    DOB: [birth year],  MRN: [id number]  Rickey Sherwood Kleiner [name] presents to clinic today for at risk foot care. Pt has h/o NIDDM with PAD and painful thick toenails that are difficult to trim. Pain interferes with ambulation. Aggravating factors include wearing enclosed shoe gear. Pain is relieved with periodic professional debridement.  Chief Complaint  Patient presents with   Nail Problem    Thick painful toenails, 9 week follow up    New problem(s): Patient states he stubbed his right 2nd toe about two weeks ago. He has been performing epsom salt soaks.   PCP is Lenon Layman ORN, MD.  Allergies  Allergen Reactions   Niacin Itching and Other (See Comments)   Penicillin G Rash   Penicillins Rash    Rash & joint pain.  And joint pain And joint pain     Review of Systems: Negative except as noted in the HPI.  Objective:  There were no vitals filed for this visit. Rickey Sherwood Vea [name] is a pleasant 89 y.o. male WD, WN in NAD. AAO x 3.  Vascular Examination: CFT <3 seconds b/l. DP/PT pulses faintly palpable b/l. Skin temperature gradient warm to warm b/l. No pain with calf compression. No ischemia or gangrene. No cyanosis or clubbing noted b/l. Pedal hair absent. Trace edema noted BLE. No varicosities noted.   Neurological Examination:Vibratory sensation intact b/l. Vibratory sensation diminished b/l.  Dermatological Examination:  Right 2nd digit lateral border with subacute paronychia. Mild erythema distal to DIPJ. No purulence, no drainage.  Pedal skin warm and supple b/l.   No open wounds. No interdigital macerations.  Toenails b/l great toes, left 2nd toe, and 3-5 b/l thick, discolored, elongated with subungual debris and pain on dorsal palpation.    No corns, calluses nor porokeratotic lesions noted.  Musculoskeletal Examination: Muscle strength 5/5 to all lower extremity muscle groups bilaterally. No pain,  crepitus or joint limitation noted with ROM bilateral LE. No gross bony deformities bilaterally. Ambulates independently without assistive devices.  Assessment/Plan: 1. Pain due to onychomycosis of toenails of both feet   2. Cellulitis of second toe of right foot   3. Type 2 diabetes mellitus with vascular disease (HCC)     Meds ordered this encounter  Medications   doxycycline  (VIBRAMYCIN ) 100 MG capsule    Sig: Take 1 capsule (100 mg total) by mouth 2 (two) times daily for 10 days.    Dispense:  20 capsule    Refill:  0  -Consent given for treatment as described below: -Examined patient. -Informed son about findings on today's visit. He related understanding of treatment plan. Patient to follow up with Dr. Verta for toe check in 2 weeks. -Patient to continue soft, supportive shoe gear daily. -Toenails bilateral great toes, 3-5 bilaterally, and L 2nd toe debrided in length and girth without iatrogenic bleeding with sterile nail nipper and dremel.  -No invasive procedure(s) performed. Offending nail border debrided and curretaged R 2nd toe utilizing sterile nail nipper and currette. Border(s) cleansed with alcohol and triple antibiotic ointment applied. Patient/POA/Caregiver/Facility instructed to apply gentamicin  cream  to R 2nd toe once daily for 14 days. Call office if there are any concerns. -Rx for Doxycyline 100 mg, #20, to be taken one capsule twice daily for 10 days. -Patient/POA to call should there be question/concern in the interim.   Return in about 2 weeks (around 03/04/2024) for nail trim with Dr. Gaynel.  Delon LITTIE Gaynel,  DPM      [facility] LOCATION: [address], [place] [address]                   Office [phone]   [facility] LOCATION: [address] [place], [place] [address] Office [phone]

## 2024-03-03 ENCOUNTER — Ambulatory Visit: Admitting: Podiatry

## 2024-03-03 DIAGNOSIS — L03031 Cellulitis of right toe: Secondary | ICD-10-CM | POA: Diagnosis not present

## 2024-03-03 NOTE — Progress Notes (Signed)
 He presents today after having completed his doxycycline  provided by Dr. May for his small abscess on his lateral aspect of his right second toe.  He states that he is doing much better has not been soaking it in Epsom salts.  Objective: Abscess has resolved pulses remain palpable it appears that the margin has been slightly resected and the toe seems to be doing better.  Assessment: Resolving abscess fibular border second digit right foot.  Plan: Encouraged Epsom salts warm water soaks cover during the day.  Follow-up with me on an as-needed basis should this flare back up we will have to do a matrixectomy.

## 2024-04-06 ENCOUNTER — Other Ambulatory Visit: Payer: Self-pay

## 2024-04-06 MED ORDER — GEMTESA 75 MG PO TABS: 75.0000 mg | ORAL_TABLET | Freq: Every day | ORAL | 11 refills | Status: AC

## 2024-04-06 NOTE — Progress Notes (Signed)
 Pt's son LVM on the triage line stating the Gemtesa  samples worked well for the pt. Returned phone call, son is not on DPR, pt was with his son and got verbalized identify confirmation by pt. Informed pt and his son that a rx for gemtesa  was sent to their pharmacy, both voiced understanding.

## 2024-04-07 ENCOUNTER — Other Ambulatory Visit: Payer: Self-pay | Admitting: Urology

## 2024-05-27 ENCOUNTER — Ambulatory Visit: Admitting: Podiatry

## 2025-01-07 ENCOUNTER — Ambulatory Visit: Admitting: Urology
